# Patient Record
Sex: Male | Born: 1975 | Race: White | Hispanic: No | Marital: Married | Smoking: Former smoker
Health system: Southern US, Community
[De-identification: ages and names within clinical notes are randomized; demographics above are authoritative.]

## PROBLEM LIST (undated history)

## (undated) DIAGNOSIS — Z8719 Personal history of other diseases of the digestive system: Secondary | ICD-10-CM

## (undated) DIAGNOSIS — E785 Hyperlipidemia, unspecified: Secondary | ICD-10-CM

## (undated) DIAGNOSIS — I7121 Aneurysm of the ascending aorta, without rupture: Secondary | ICD-10-CM

## (undated) DIAGNOSIS — M87051 Idiopathic aseptic necrosis of right femur: Secondary | ICD-10-CM

## (undated) DIAGNOSIS — F17200 Nicotine dependence, unspecified, uncomplicated: Secondary | ICD-10-CM

## (undated) DIAGNOSIS — K219 Gastro-esophageal reflux disease without esophagitis: Secondary | ICD-10-CM

## (undated) HISTORY — DX: Hyperlipidemia, unspecified: E78.5

## (undated) HISTORY — DX: Aneurysm of the ascending aorta, without rupture: I71.21

## (undated) HISTORY — DX: Nicotine dependence, unspecified, uncomplicated: F17.200

## (undated) HISTORY — PX: EYE SURGERY: SHX253

## (undated) HISTORY — PX: TONSILLECTOMY: SUR1361

## (undated) HISTORY — PX: SPINE SURGERY: SHX786

## (undated) HISTORY — PX: OTHER SURGICAL HISTORY: SHX169

---

## 2000-06-04 ENCOUNTER — Emergency Department (HOSPITAL_COMMUNITY): Admission: EM | Admit: 2000-06-04 | Discharge: 2000-06-04 | Payer: Self-pay | Admitting: Emergency Medicine

## 2000-06-04 ENCOUNTER — Encounter: Payer: Self-pay | Admitting: Emergency Medicine

## 2001-10-06 ENCOUNTER — Emergency Department (HOSPITAL_COMMUNITY): Admission: EM | Admit: 2001-10-06 | Discharge: 2001-10-06 | Payer: Self-pay | Admitting: Emergency Medicine

## 2005-10-14 ENCOUNTER — Emergency Department (HOSPITAL_COMMUNITY): Admission: EM | Admit: 2005-10-14 | Discharge: 2005-10-14 | Payer: Self-pay | Admitting: Emergency Medicine

## 2005-10-14 ENCOUNTER — Encounter: Payer: Self-pay | Admitting: Orthopedic Surgery

## 2006-03-31 ENCOUNTER — Ambulatory Visit: Payer: Self-pay | Admitting: Orthopedic Surgery

## 2006-08-04 ENCOUNTER — Emergency Department (HOSPITAL_COMMUNITY): Admission: EM | Admit: 2006-08-04 | Discharge: 2006-08-04 | Payer: Self-pay | Admitting: Emergency Medicine

## 2007-04-18 ENCOUNTER — Ambulatory Visit: Payer: Self-pay | Admitting: Orthopedic Surgery

## 2007-04-18 DIAGNOSIS — M25559 Pain in unspecified hip: Secondary | ICD-10-CM | POA: Insufficient documentation

## 2007-06-22 ENCOUNTER — Encounter: Payer: Self-pay | Admitting: Orthopedic Surgery

## 2007-07-04 ENCOUNTER — Ambulatory Visit (HOSPITAL_COMMUNITY): Admission: RE | Admit: 2007-07-04 | Discharge: 2007-07-04 | Payer: Self-pay | Admitting: Orthopedic Surgery

## 2007-07-10 ENCOUNTER — Encounter: Payer: Self-pay | Admitting: Orthopedic Surgery

## 2008-01-14 ENCOUNTER — Emergency Department (HOSPITAL_COMMUNITY): Admission: EM | Admit: 2008-01-14 | Discharge: 2008-01-14 | Payer: Self-pay | Admitting: Family Medicine

## 2010-03-04 ENCOUNTER — Ambulatory Visit (INDEPENDENT_AMBULATORY_CARE_PROVIDER_SITE_OTHER): Payer: BC Managed Care – PPO | Admitting: Orthopedic Surgery

## 2010-03-04 ENCOUNTER — Encounter: Payer: Self-pay | Admitting: Orthopedic Surgery

## 2010-03-04 DIAGNOSIS — S8263XA Displaced fracture of lateral malleolus of unspecified fibula, initial encounter for closed fracture: Secondary | ICD-10-CM | POA: Insufficient documentation

## 2010-03-11 NOTE — Assessment & Plan Note (Signed)
Summary: EVAL/TREAT LT FIB FX/BRING'G FILM/REF GUARINO"NEW PROB"/BCBS,...   Vital Signs:  Patient profile:   35 year old male Height:      75 inches Weight:      260 pounds BMI:     32.62 Pulse rate:   76 / minute Resp:     18 per minute  Vitals Entered By: Fuller Canada MD (March 04, 2010 2:51 PM)  Visit Type:  new problem Referring Provider:  Dr. Wende Crease Primary Provider:  Dr. Gilmore Laroche  CC:  left ankle pain.  History of Present Illness: I saw Sergio Gonzalez in the office today for a new problem visit.  He is a 35 years old man with the complaint of:  left ankle pain.  DOI 03/02/10.  Xrays Dr. Tenna Delaine office on 03/04/10.  No meds.  This patient was referred to [place] by Dr. energetic here. On the 30th. He stepped in a hole twisted his LEFT ankle. Radiographs were brought over on a disc, but they were not of good quality, so they were repeated. He has a distal fibular avulsion type fracture, nondisplaced.  He complains of sharp throbbing, stabbing, 8/10 pain, which is constant, worse with walking relieved by rest,.  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  The review of systems is negative for Constitutional, Cardiovascular, Respiratory, Genitourinary, Neurologic, Musculoskeletal, Endocrine, Psychiatric, Skin, HEENT, Immunology, and Hemoatologic.  Physical Exam  Additional Exam:  GEN: well developed, well nourished, normal grooming and hygiene, no deformity and normal body habitus.   CDV:  pulses are normal, no edema, no erythema. no tenderness  Lymph: normal lymph nodes   Skin: no rashes, skin lesions or open sores   NEURO: normal coordination, reflexes, sensation.   Psyche: awake, alert and oriented. Mood normal   Gait:ambulation with crutches.  Inspection reveals a swollen ankle is tender medially and laterally. Primarily at the inferior pole. The fibula. Achilles Tendon nontender. He has approximately 20 of motion joint. Plantarflexion and dorsiflexion are normal. In terms of strength. Ankle feels stable.      Medications Added to Medication List This Visit: 1)  Ibuprofen 800 Mg Tabs (Ibuprofen) .Marland Kitchen.. 1 three times a day 2)  Tylenol With Codeine #3 300-30 Mg Tabs (Acetaminophen-codeine) .Marland Kitchen.. 1 q 4 as needed pain  Other Orders: Est. Patient Level IV (16109)  Patient Instructions: 1)  Wear brace x 4 weeks  2)  rest this week  3)  ice and elevate this week three times a day for 30 min  4)  then every evening  5)  take Ibuprofen 800 mg three times a day and Tylenol # 3 q 4 hrs as needed  Prescriptions: TYLENOL WITH CODEINE #3 300-30 MG TABS (ACETAMINOPHEN-CODEINE) 1 q 4 as needed pain  #40 x 1   Entered and Authorized by:   Fuller Canada MD   Signed by:   Fuller Canada MD on 03/04/2010   Method used:   Print then Give to Patient   RxID:   [id number] IBUPROFEN 800 MG  TABS (IBUPROFEN) 1 three times a day  #90 x 1   Entered and Authorized by:   Fuller Canada MD   Signed by:   Fuller Canada MD on 03/04/2010   Method used:   Print then Give to Patient   RxID:   [id number]    Orders Added: 1)  Est. Patient Level IV [14782]  Appended Document: EVAL/TREAT LT FIB FX/BRING'G FILM/REF GUARINO"NEW PROB"/BCBS,... a separate x-ray report.  AP, lateral, and oblique of the LEFT fibula  at the inferior pole of the fibula. There is a transverse nondisplaced avulsion-type injury with soft tissue swelling surrounding.  Impression avulsion Weber  A fracture, fibula.

## 2010-03-25 NOTE — Letter (Signed)
Summary: History form  History form   Imported By: Jacklynn Ganong 03/20/2010 10:52:17  _____________________________________________________________________  External Attachment:    Type:   Image     Comment:   External Document

## 2010-04-02 ENCOUNTER — Encounter: Payer: Self-pay | Admitting: Orthopedic Surgery

## 2010-04-02 ENCOUNTER — Ambulatory Visit: Payer: BC Managed Care – PPO | Admitting: Orthopedic Surgery

## 2010-05-12 ENCOUNTER — Ambulatory Visit: Payer: BC Managed Care – PPO | Admitting: Orthopedic Surgery

## 2010-05-26 ENCOUNTER — Encounter: Payer: Self-pay | Admitting: Orthopedic Surgery

## 2010-05-26 ENCOUNTER — Ambulatory Visit (INDEPENDENT_AMBULATORY_CARE_PROVIDER_SITE_OTHER): Payer: BC Managed Care – PPO | Admitting: Orthopedic Surgery

## 2010-05-26 DIAGNOSIS — S8263XA Displaced fracture of lateral malleolus of unspecified fibula, initial encounter for closed fracture: Secondary | ICD-10-CM

## 2010-05-26 NOTE — Progress Notes (Signed)
   Transverse nondisplaced fibular fracture treated as a Weber a injury doing well no problems with range of motion no brace required  Ankle has a trace drawer sign but stable with a firm endpoint and no pain on inversion.  No tenderness over the fracture site.  Impression healed Weber A-type injury treated as an ankle sprain and avulsion  Follow up as needed

## 2010-11-17 LAB — COMPREHENSIVE METABOLIC PANEL
ALT: 46
AST: 27
Albumin: 4.2
Chloride: 103
Creatinine, Ser: 0.98
GFR calc Af Amer: >60
Sodium: 138
Total Bilirubin: 0.9

## 2010-11-17 LAB — DIFFERENTIAL
Basophils Absolute: 0.1
Eosinophils Relative: 6 — ABNORMAL HIGH
Lymphocytes Relative: 28
Lymphs Abs: 2.7
Monocytes Absolute: 0.7

## 2010-11-17 LAB — CBC
MCV: 89
Platelets: 178
WBC: 9.5

## 2010-11-17 LAB — POCT CARDIAC MARKERS: Troponin i, poc: <0.05

## 2011-05-31 ENCOUNTER — Other Ambulatory Visit (HOSPITAL_COMMUNITY): Payer: Self-pay | Admitting: Chiropractic Medicine

## 2011-05-31 DIAGNOSIS — M542 Cervicalgia: Secondary | ICD-10-CM

## 2011-06-02 ENCOUNTER — Ambulatory Visit (HOSPITAL_COMMUNITY)
Admission: RE | Admit: 2011-06-02 | Discharge: 2011-06-02 | Disposition: A | Discharge status: Home / Self Care | Payer: 59 | Source: Ambulatory Visit | Attending: Chiropractic Medicine | Admitting: Chiropractic Medicine

## 2011-06-02 DIAGNOSIS — M5380 Other specified dorsopathies, site unspecified: Secondary | ICD-10-CM | POA: Insufficient documentation

## 2011-06-02 DIAGNOSIS — M5020 Other cervical disc displacement, unspecified cervical region: Secondary | ICD-10-CM | POA: Insufficient documentation

## 2011-06-02 DIAGNOSIS — M542 Cervicalgia: Secondary | ICD-10-CM

## 2011-06-02 DIAGNOSIS — M25519 Pain in unspecified shoulder: Secondary | ICD-10-CM | POA: Insufficient documentation

## 2011-08-01 ENCOUNTER — Encounter (HOSPITAL_BASED_OUTPATIENT_CLINIC_OR_DEPARTMENT_OTHER): Payer: Self-pay | Admitting: *Deleted

## 2011-08-01 ENCOUNTER — Emergency Department (HOSPITAL_BASED_OUTPATIENT_CLINIC_OR_DEPARTMENT_OTHER)
Admission: EM | Admit: 2011-08-01 | Discharge: 2011-08-01 | Disposition: A | Discharge status: Home / Self Care | Payer: 59 | Attending: Emergency Medicine | Admitting: Emergency Medicine

## 2011-08-01 DIAGNOSIS — S30861A Insect bite (nonvenomous) of abdominal wall, initial encounter: Secondary | ICD-10-CM

## 2011-08-01 DIAGNOSIS — W57XXXA Bitten or stung by nonvenomous insect and other nonvenomous arthropods, initial encounter: Secondary | ICD-10-CM | POA: Insufficient documentation

## 2011-08-01 DIAGNOSIS — S30860A Insect bite (nonvenomous) of lower back and pelvis, initial encounter: Secondary | ICD-10-CM | POA: Insufficient documentation

## 2011-08-01 DIAGNOSIS — S2095XA Superficial foreign body of unspecified parts of thorax, initial encounter: Secondary | ICD-10-CM | POA: Insufficient documentation

## 2011-08-01 MED ORDER — DOXYCYCLINE HYCLATE 100 MG PO CAPS: 100.0000 mg | ORAL_CAPSULE | Freq: Two times a day (BID) | ORAL | Status: AC

## 2011-08-01 NOTE — Discharge Instructions (Signed)
Insect Bite Mosquitoes, flies, fleas, bedbugs, and many other insects can bite. Insect bites are different from insect stings. A sting is when venom is injected into the skin. Some insect bites can transmit infectious diseases. SYMPTOMS  Insect bites usually turn red, swell, and itch for 2 to 4 days. They often go away on their own. TREATMENT  Your caregiver may prescribe antibiotic medicines if a bacterial infection develops in the bite. HOME CARE INSTRUCTIONS  Do not scratch the bite area.   Keep the bite area clean and dry. Wash the bite area thoroughly with soap and water.   Put ice or cool compresses on the bite area.   Put ice in a plastic bag.   Place a towel between your skin and the bag.   Leave the ice on for 20 minutes, 4 times a day for the first 2 to 3 days, or as directed.   You may apply a baking soda paste, cortisone cream, or calamine lotion to the bite area as directed by your caregiver. This can help reduce itching and swelling.   Only take over-the-counter or prescription medicines as directed by your caregiver.   If you are given antibiotics, take them as directed. Finish them even if you start to feel better.  You may need a tetanus shot if:  You cannot remember when you had your last tetanus shot.   You have never had a tetanus shot.   The injury broke your skin.  If you get a tetanus shot, your arm may swell, get red, and feel warm to the touch. This is common and not a problem. If you need a tetanus shot and you choose not to have one, there is a rare chance of getting tetanus. Sickness from tetanus can be serious. SEEK IMMEDIATE MEDICAL CARE IF:   You have increased pain, redness, or swelling in the bite area.   You see a red line on the skin coming from the bite.   You have a fever.   You have joint pain.   You have a headache or neck pain.   You have unusual weakness.   You have a rash.   You have chest pain or shortness of breath.   You  have abdominal pain, nausea, or vomiting.   You feel unusually tired or sleepy.  MAKE SURE YOU:   Understand these instructions.   Will watch your condition.   Will get help right away if you are not doing well or get worse.  Document Released: 02/26/2004 Document Revised: 01/07/2011 Document Reviewed: 08/19/2010 Exit[employer] Patient Information 2012 [employer]. 

## 2011-08-01 NOTE — ED Notes (Signed)
Pt presents to ED today with tick bite to lower left flank.  Pt states unsure if head was removed.  Reports puss until yesterday and now area is raised and reddened.

## 2011-08-01 NOTE — ED Provider Notes (Signed)
History  This chart was scribed for Sergio Umscheid [name], MD by Sergio Gonzalez. This patient was seen in room MH10/MH10 and the patient's care was started at 2327.  CSN: [id number]  Arrival date & time 08/01/11  2241   First MD Initiated Contact with Patient 08/01/11 2327      Chief Complaint  Patient presents with  . Tick Removal    The history is provided by the patient. No language interpreter was used.    Sergio Gonzalez is a 36 y.o. male who presents to the Emergency Department complaining of 2 weeks of a sudden onset, gradually worsening, constant tick bite. Pt states that he pulled the tick off but still felt that a piece was still in the bite. He reports that the area has become more red and swollen. He states that he had his wife use sterilized tweezers but she was unable to get the rest of the tick out. He reports having discharge consistent with pus several days ago. He has not seen a MD for the symptoms prior to today. He denies fever, nausea, emesis, HA and abdominal as associated symptoms.TD vaccine is UTD. He does not have a h/o chronic medical conditions. He is a current everyday smoker and occasional alcohol user.  No f/c/r.  No rashes not neck pain no photophobia   History reviewed. No pertinent past medical history.  Past Surgical History  Procedure Date  . Tonsillectomy   . Lasic     Family History  Problem Relation Age of Onset  . Cancer      family history   . Diabetes      family history     History  Substance Use Topics  . Smoking status: Current Everyday Smoker    Types: Cigarettes  . Smokeless tobacco: Not on file  . Alcohol Use: Yes     12 beers per week       Review of Systems  Constitutional: Negative for fever and chills.  HENT: Negative for congestion, sore throat, neck pain and neck stiffness.   Eyes: Negative for redness and itching.  Respiratory: Negative for cough and shortness of breath.   Cardiovascular: Negative for chest pain.    Gastrointestinal: Negative for nausea, vomiting, abdominal pain and diarrhea.  Genitourinary: Negative for dysuria, urgency and hematuria.  Musculoskeletal: Negative for back pain and arthralgias.  Skin: Positive for wound. Negative for rash.  Neurological: Negative for light-headedness and headaches.  Hematological: Negative for adenopathy.  All other systems reviewed and are negative.    Allergies  Review of patient's allergies indicates no known allergies.  Home Medications   Current Outpatient Rx  Name Route Sig Dispense Refill  . TRIPLE ANTIBIOTIC [phone] EX OINT Topical Apply 1 application topically once as needed. For tick bite    . OMEPRAZOLE 20 MG PO CPDR Oral Take 20 mg by mouth daily.    Marland Kitchen TAPENTADOL HCL 75 MG PO TABS Oral Take 1 tablet by mouth 2 (two) times daily.      Triage Vitals: BP 133/85  Pulse 89  Temp 98 F (36.7 C) (Oral)  Resp 18  Ht 6\' 4"  (1.93 m)  Wt 275 lb (124.739 kg)  BMI 33.47 kg/m2  SpO2 100%  Physical Exam  Nursing note and vitals reviewed. Constitutional: He is oriented to person, place, and time. He appears well-developed and well-nourished.  HENT:  Head: Normocephalic and atraumatic.  Mouth/Throat: Oropharynx is clear and moist.  Eyes: Conjunctivae and EOM are normal. Pupils  are equal, round, and reactive to light.  Neck: Normal range of motion. Neck supple. No tracheal deviation present.  Cardiovascular: Normal rate and regular rhythm.  Exam reveals no gallop and no friction rub.   No murmur heard. Pulmonary/Chest: Effort normal and breath sounds normal. No respiratory distress. He has no wheezes.  Abdominal: Soft. Bowel sounds are normal. He exhibits no distension. There is no tenderness. There is no rebound and no guarding.  Musculoskeletal: Normal range of motion. He exhibits no edema.  Lymphadenopathy:    He has no cervical adenopathy.  Neurological: He is alert and oriented to person, place, and time. He displays normal  reflexes. No cranial nerve deficit.  Skin: Skin is warm and dry.       1.5 cm round spot at the lower left flank with a scab, 3 mm crater with a small piece of what appears to be a tick pincher, nothing else noted  Psychiatric: He has a normal mood and affect. His behavior is normal.    ED Course  INCISION AND DRAINAGE Date/Time: 08/01/2011 11:38 PM Performed by: Manuela Schwartz Authorized by: Jasmine Awe Consent: Verbal consent obtained. Risks and benefits: risks, benefits and alternatives were discussed Consent given by: patient Indications for incision and drainage: Tick bite with possible remains still in skin. Body area: trunk Wound treatment: Pulled out incisors of tick with tweezers and patient tolerated well. Comments: Sterilized the 5 cm round wound/tick bite with chlorhexidine and provodone before using tweezers to excise part of the tick still left in skin. Patient tolerated well after pulling out 3 mm crater from wound and re-sterilized wound before searching again.    DIAGNOSTIC STUDIES: Oxygen Saturation is 100% on room air, normal by my interpretation.    COORDINATION OF CARE: 12:00AM-Discussed discharge plan of doxycycline with pt and pt agreed to plan. Advised pt to follow up with PCP.   Labs Reviewed - No data to display No results found.   No diagnosis found.    MDM  Symptoms consistent with an reaction to a tick bite. Will prescribe doxycycline. Follow up with PCP for testing and ongoing care return for fevers, stiff neck rashes on the skin or any concerns.  Patient and wife verbalize understanding and agree to follow up  I personally performed the services described in this documentation, which was scribed in my presence. The recorded information has been reviewed and considered.         Jasmine Awe, MD 08/02/11 [phone]

## 2011-08-18 ENCOUNTER — Encounter (HOSPITAL_COMMUNITY): Payer: Self-pay | Admitting: Pharmacy Technician

## 2011-08-18 ENCOUNTER — Other Ambulatory Visit: Payer: Self-pay | Admitting: Neurosurgery

## 2011-08-18 ENCOUNTER — Encounter (HOSPITAL_COMMUNITY): Payer: Self-pay

## 2011-08-18 ENCOUNTER — Encounter (HOSPITAL_COMMUNITY)
Admission: RE | Admit: 2011-08-18 | Discharge: 2011-08-18 | Disposition: A | Discharge status: Home / Self Care | Payer: 59 | Source: Ambulatory Visit | Attending: Neurosurgery | Admitting: Neurosurgery

## 2011-08-18 HISTORY — DX: Personal history of other diseases of the digestive system: Z87.19

## 2011-08-18 HISTORY — DX: Gastro-esophageal reflux disease without esophagitis: K21.9

## 2011-08-18 HISTORY — DX: Idiopathic aseptic necrosis of right femur: M87.051

## 2011-08-18 LAB — CBC
Platelets: 191 10*3/uL (ref 150–400)
RBC: 5.55 MIL/uL (ref 4.22–5.81)
WBC: 14.1 10*3/uL — ABNORMAL HIGH (ref 4.0–10.5)

## 2011-08-18 LAB — COMPREHENSIVE METABOLIC PANEL
ALT: 62 U/L — ABNORMAL HIGH (ref 0–53)
AST: 28 U/L (ref 0–37)
Albumin: 4.4 g/dL (ref 3.5–5.2)
CO2: 21 mEq/L (ref 19–32)
Chloride: 98 mEq/L (ref 96–112)
GFR calc non Af Amer: >90 mL/min (ref >90)
Potassium: 3.7 mEq/L (ref 3.5–5.1)
Sodium: 136 mEq/L (ref 135–145)
Total Bilirubin: 0.6 mg/dL (ref 0.3–1.2)

## 2011-08-18 LAB — SURGICAL PCR SCREEN
MRSA, PCR: NEGATIVE
Staphylococcus aureus: NEGATIVE

## 2011-08-18 NOTE — Pre-Procedure Instructions (Signed)
20 AVID GUILLETTE  08/18/2011   Your procedure is scheduled on:  July 19  Report to [facility] Stay Center at 08:30 AM.  Call this number if you have problems the morning of surgery: [phone]   Remember:   Do not eat food or water:After Midnight.  Clear liquids include soda, tea, black coffee, apple or grape juice, broth.  Take these medicines the morning of surgery with A SIP OF WATER: Omeprazole, Nucynta, Neosporin   Do not wear jewelry, make-up or nail polish.  Do not wear lotions, powders, or perfumes. You may wear deodorant.  Do not shave 48 hours prior to surgery. Men may shave face and neck.  Do not bring valuables to the hospital.  Contacts, dentures or bridgework may not be worn into surgery.  Leave suitcase in the car. After surgery it may be brought to your room.  For patients admitted to the hospital, checkout time is 11:00 AM the day of discharge.   Special Instructions: CHG Shower Use Special Wash: 1/2 bottle night before surgery and 1/2 bottle morning of surgery.   Please read over the following fact sheets that you were given: Pain Booklet, Coughing and Deep Breathing and Surgical Site Infection Prevention

## 2011-08-18 NOTE — Pre-Procedure Instructions (Signed)
20 Sergio Gonzalez  08/18/2011   Your procedure is scheduled on:  July 19  Report to [facility] Short Stay Center at 08:30 AM.  Call this number if you have problems the morning of surgery: [phone]   Remember:   Do not eat food or water:After Midnight.  Clear liquids include soda, tea, black coffee, apple or grape juice, broth.  Take these medicines the morning of surgery with A SIP OF WATER: Omeprazole, Nucynta, Neosporin, Prednisone, Hydrocodone,    Do not wear jewelry, make-up or nail polish.  Do not wear lotions, powders, or perfumes. You may wear deodorant.  Do not shave 48 hours prior to surgery. Men may shave face and neck.  Do not bring valuables to the hospital.  Contacts, dentures or bridgework may not be worn into surgery.  Leave suitcase in the car. After surgery it may be brought to your room.  For patients admitted to the hospital, checkout time is 11:00 AM the day of discharge.   Special Instructions: CHG Shower Use Special Wash: 1/2 bottle night before surgery and 1/2 bottle morning of surgery.   Please read over the following fact sheets that you were given: Pain Booklet, Coughing and Deep Breathing and Surgical Site Infection Prevention

## 2011-08-19 MED ORDER — CEFAZOLIN SODIUM-DEXTROSE 2-3 GM-% IV SOLR
2.0000 g | INTRAVENOUS | Status: AC
  Administered 2011-08-20: 2 g via INTRAVENOUS
  Filled 2011-08-19: qty 50

## 2011-08-19 MED ORDER — DEXAMETHASONE SODIUM PHOSPHATE 10 MG/ML IJ SOLN
10.0000 mg | INTRAMUSCULAR | Status: AC
  Administered 2011-08-20: 10 mg via INTRAVENOUS
  Filled 2011-08-19: qty 1

## 2011-08-20 ENCOUNTER — Encounter (HOSPITAL_COMMUNITY): Payer: Self-pay | Admitting: Critical Care Medicine

## 2011-08-20 ENCOUNTER — Encounter (HOSPITAL_COMMUNITY): Payer: Self-pay | Admitting: *Deleted

## 2011-08-20 ENCOUNTER — Ambulatory Visit (HOSPITAL_COMMUNITY): Payer: 59 | Admitting: Critical Care Medicine

## 2011-08-20 ENCOUNTER — Ambulatory Visit (HOSPITAL_COMMUNITY): Payer: 59

## 2011-08-20 ENCOUNTER — Encounter (HOSPITAL_COMMUNITY)
Admission: RE | Disposition: A | Discharge status: Home / Self Care | Payer: Self-pay | Source: Ambulatory Visit | Attending: Neurosurgery

## 2011-08-20 ENCOUNTER — Ambulatory Visit (HOSPITAL_COMMUNITY)
Admission: RE | Admit: 2011-08-20 | Discharge: 2011-08-21 | Disposition: A | Discharge status: Home / Self Care | Payer: 59 | Source: Ambulatory Visit | Attending: Neurosurgery | Admitting: Neurosurgery

## 2011-08-20 DIAGNOSIS — F17200 Nicotine dependence, unspecified, uncomplicated: Secondary | ICD-10-CM | POA: Insufficient documentation

## 2011-08-20 DIAGNOSIS — Z01812 Encounter for preprocedural laboratory examination: Secondary | ICD-10-CM | POA: Insufficient documentation

## 2011-08-20 DIAGNOSIS — M5020 Other cervical disc displacement, unspecified cervical region: Secondary | ICD-10-CM | POA: Insufficient documentation

## 2011-08-20 DIAGNOSIS — K449 Diaphragmatic hernia without obstruction or gangrene: Secondary | ICD-10-CM | POA: Insufficient documentation

## 2011-08-20 DIAGNOSIS — K219 Gastro-esophageal reflux disease without esophagitis: Secondary | ICD-10-CM | POA: Insufficient documentation

## 2011-08-20 HISTORY — PX: ANTERIOR CERVICAL DECOMP/DISCECTOMY FUSION: SHX1161

## 2011-08-20 SURGERY — ANTERIOR CERVICAL DECOMPRESSION/DISCECTOMY FUSION 2 LEVELS
Anesthesia: General | Site: Neck | Laterality: Bilateral | Wound class: Clean

## 2011-08-20 MED ORDER — ONDANSETRON HCL 4 MG/2ML IJ SOLN: 4.0000 mg | INTRAMUSCULAR | Status: DC | PRN

## 2011-08-20 MED ORDER — SODIUM CHLORIDE 0.9 % IR SOLN
Status: DC | PRN
  Administered 2011-08-20: 11:00:00

## 2011-08-20 MED ORDER — ACETAMINOPHEN 650 MG RE SUPP: 650.0000 mg | RECTAL | Status: DC | PRN

## 2011-08-20 MED ORDER — SODIUM CHLORIDE 0.9 % IJ SOLN
3.0000 mL | Freq: Two times a day (BID) | INTRAMUSCULAR | Status: DC
  Administered 2011-08-20 (×2): 3 mL via INTRAVENOUS

## 2011-08-20 MED ORDER — NEOSTIGMINE METHYLSULFATE 1 MG/ML IJ SOLN
INTRAMUSCULAR | Status: DC | PRN
  Administered 2011-08-20: 5 mg via INTRAVENOUS

## 2011-08-20 MED ORDER — ONDANSETRON HCL 4 MG/2ML IJ SOLN: 4.0000 mg | Freq: Once | INTRAMUSCULAR | Status: DC | PRN

## 2011-08-20 MED ORDER — HYDROMORPHONE HCL PF 1 MG/ML IJ SOLN
1.0000 mg | INTRAMUSCULAR | Status: DC | PRN
  Administered 2011-08-20 – 2011-08-21 (×3): 1.5 mg via INTRAMUSCULAR
  Filled 2011-08-20 (×3): qty 2

## 2011-08-20 MED ORDER — THROMBIN 5000 UNITS EX KIT
PACK | CUTANEOUS | Status: DC | PRN
  Administered 2011-08-20 (×2): 5000 [IU] via TOPICAL

## 2011-08-20 MED ORDER — MENTHOL 3 MG MT LOZG
1.0000 | LOZENGE | OROMUCOSAL | Status: DC | PRN
  Administered 2011-08-20: 3 mg via ORAL
  Filled 2011-08-20: qty 9

## 2011-08-20 MED ORDER — SODIUM CHLORIDE 0.9 % IJ SOLN: 3.0000 mL | INTRAMUSCULAR | Status: DC | PRN

## 2011-08-20 MED ORDER — CYCLOBENZAPRINE HCL 10 MG PO TABS
10.0000 mg | ORAL_TABLET | Freq: Three times a day (TID) | ORAL | Status: DC | PRN
  Administered 2011-08-20 (×2): 10 mg via ORAL
  Filled 2011-08-20: qty 1

## 2011-08-20 MED ORDER — HYDROMORPHONE HCL PF 1 MG/ML IJ SOLN
0.2500 mg | INTRAMUSCULAR | Status: DC | PRN
  Administered 2011-08-20 (×4): 0.5 mg via INTRAVENOUS

## 2011-08-20 MED ORDER — 0.9 % SODIUM CHLORIDE (POUR BTL) OPTIME
TOPICAL | Status: DC | PRN
  Administered 2011-08-20: 1000 mL

## 2011-08-20 MED ORDER — DEXAMETHASONE 4 MG PO TABS
4.0000 mg | ORAL_TABLET | Freq: Four times a day (QID) | ORAL | Status: AC
  Administered 2011-08-20: 4 mg via ORAL
  Filled 2011-08-20: qty 1

## 2011-08-20 MED ORDER — FENTANYL CITRATE 0.05 MG/ML IJ SOLN
INTRAMUSCULAR | Status: DC | PRN
  Administered 2011-08-20: 150 ug via INTRAVENOUS
  Administered 2011-08-20 (×3): 50 ug via INTRAVENOUS

## 2011-08-20 MED ORDER — BACITRACIN ZINC 500 UNIT/GM EX OINT: TOPICAL_OINTMENT | CUTANEOUS | Status: DC | PRN

## 2011-08-20 MED ORDER — PHENOL 1.4 % MT LIQD
1.0000 | OROMUCOSAL | Status: DC | PRN
  Administered 2011-08-21: 1 via OROMUCOSAL
  Filled 2011-08-20: qty 177

## 2011-08-20 MED ORDER — ZOLPIDEM TARTRATE 5 MG PO TABS: 5.0000 mg | ORAL_TABLET | Freq: Every evening | ORAL | Status: DC | PRN

## 2011-08-20 MED ORDER — LACTATED RINGERS IV SOLN
INTRAVENOUS | Status: DC | PRN
  Administered 2011-08-20 (×2): via INTRAVENOUS

## 2011-08-20 MED ORDER — HYDROCODONE-ACETAMINOPHEN 5-325 MG PO TABS
1.0000 | ORAL_TABLET | ORAL | Status: DC | PRN
  Administered 2011-08-20 (×2): 2 via ORAL
  Administered 2011-08-20: 1 via ORAL
  Administered 2011-08-21 (×2): 2 via ORAL
  Filled 2011-08-20 (×4): qty 2

## 2011-08-20 MED ORDER — DEXAMETHASONE SODIUM PHOSPHATE 4 MG/ML IJ SOLN
4.0000 mg | Freq: Four times a day (QID) | INTRAMUSCULAR | Status: AC
  Administered 2011-08-20: 4 mg via INTRAVENOUS
  Filled 2011-08-20: qty 1

## 2011-08-20 MED ORDER — VECURONIUM BROMIDE 10 MG IV SOLR
INTRAVENOUS | Status: DC | PRN
  Administered 2011-08-20: 2 mg via INTRAVENOUS
  Administered 2011-08-20: 4 mg via INTRAVENOUS
  Administered 2011-08-20: 2 mg via INTRAVENOUS

## 2011-08-20 MED ORDER — LIDOCAINE HCL (CARDIAC) 20 MG/ML IV SOLN
INTRAVENOUS | Status: DC | PRN
  Administered 2011-08-20: 100 mg via INTRAVENOUS

## 2011-08-20 MED ORDER — GABAPENTIN 300 MG PO CAPS
300.0000 mg | ORAL_CAPSULE | Freq: Two times a day (BID) | ORAL | Status: DC
  Administered 2011-08-20 – 2011-08-21 (×3): 300 mg via ORAL
  Filled 2011-08-20 (×4): qty 1

## 2011-08-20 MED ORDER — CYCLOBENZAPRINE HCL 10 MG PO TABS
ORAL_TABLET | ORAL | Status: AC
  Filled 2011-08-20: qty 1

## 2011-08-20 MED ORDER — ROCURONIUM BROMIDE 100 MG/10ML IV SOLN
INTRAVENOUS | Status: DC | PRN
  Administered 2011-08-20: 50 mg via INTRAVENOUS

## 2011-08-20 MED ORDER — ACETAMINOPHEN 325 MG PO TABS: 650.0000 mg | ORAL_TABLET | ORAL | Status: DC | PRN

## 2011-08-20 MED ORDER — CEFAZOLIN SODIUM 1-5 GM-% IV SOLN
1.0000 g | Freq: Three times a day (TID) | INTRAVENOUS | Status: AC
  Administered 2011-08-20 – 2011-08-21 (×2): 1 g via INTRAVENOUS
  Filled 2011-08-20 (×2): qty 50

## 2011-08-20 MED ORDER — ONDANSETRON HCL 4 MG/2ML IJ SOLN
INTRAMUSCULAR | Status: DC | PRN
  Administered 2011-08-20: 4 mg via INTRAVENOUS

## 2011-08-20 MED ORDER — ARTIFICIAL TEARS OP OINT
TOPICAL_OINTMENT | OPHTHALMIC | Status: DC | PRN
  Administered 2011-08-20: 1 via OPHTHALMIC

## 2011-08-20 MED ORDER — BACITRACIN 50000 UNITS IM SOLR
INTRAMUSCULAR | Status: AC
  Filled 2011-08-20: qty 1

## 2011-08-20 MED ORDER — PANTOPRAZOLE SODIUM 40 MG PO TBEC
40.0000 mg | DELAYED_RELEASE_TABLET | Freq: Every day | ORAL | Status: DC
  Administered 2011-08-21: 40 mg via ORAL
  Filled 2011-08-20: qty 1

## 2011-08-20 MED ORDER — HYDROMORPHONE HCL PF 1 MG/ML IJ SOLN
INTRAMUSCULAR | Status: AC
  Filled 2011-08-20: qty 1

## 2011-08-20 MED ORDER — GLYCOPYRROLATE 0.2 MG/ML IJ SOLN
INTRAMUSCULAR | Status: DC | PRN
  Administered 2011-08-20: .8 mg via INTRAVENOUS

## 2011-08-20 MED ORDER — PROPOFOL 10 MG/ML IV EMUL
INTRAVENOUS | Status: DC | PRN
  Administered 2011-08-20 (×2): 100 mg via INTRAVENOUS

## 2011-08-20 MED ORDER — SODIUM CHLORIDE 0.9 % IV SOLN
250.0000 mL | INTRAVENOUS | Status: DC
  Administered 2011-08-20: 250 mL via INTRAVENOUS

## 2011-08-20 MED ORDER — SODIUM CHLORIDE 0.9 % IV SOLN
INTRAVENOUS | Status: AC
  Filled 2011-08-20: qty 500

## 2011-08-20 MED ORDER — HYDROCODONE-ACETAMINOPHEN 5-325 MG PO TABS
ORAL_TABLET | ORAL | Status: AC
  Filled 2011-08-20: qty 1

## 2011-08-20 MED ORDER — MIDAZOLAM HCL 5 MG/5ML IJ SOLN
INTRAMUSCULAR | Status: DC | PRN
  Administered 2011-08-20: 2 mg via INTRAVENOUS

## 2011-08-20 MED ORDER — HEMOSTATIC AGENTS (NO CHARGE) OPTIME
TOPICAL | Status: DC | PRN
  Administered 2011-08-20: 1 via TOPICAL

## 2011-08-20 SURGICAL SUPPLY — 60 items
APL SKNCLS STERI-STRIP NONHPOA (GAUZE/BANDAGES/DRESSINGS) ×1
BAG DECANTER FOR FLEXI CONT (MISCELLANEOUS) ×2 IMPLANT
BENZOIN TINCTURE PRP APPL 2/3 (GAUZE/BANDAGES/DRESSINGS) ×4 IMPLANT
BIT DRILL TRINICA 2.3MM (BIT) IMPLANT
BRUSH SCRUB EZ PLAIN DRY (MISCELLANEOUS) ×2 IMPLANT
CANISTER SUCTION 2500CC (MISCELLANEOUS) ×2 IMPLANT
CLOTH BEACON ORANGE TIMEOUT ST (SAFETY) ×2 IMPLANT
CONT SPEC 4OZ CLIKSEAL STRL BL (MISCELLANEOUS) ×2 IMPLANT
DRAPE C-ARM 42X72 X-RAY (DRAPES) ×4 IMPLANT
DRAPE LAPAROTOMY 100X72 PEDS (DRAPES) ×2 IMPLANT
DRAPE MICROSCOPE LEICA (MISCELLANEOUS) ×1 IMPLANT
DRAPE MICROSCOPE ZEISS OPMI (DRAPES) ×1 IMPLANT
DRAPE SURG 17X23 STRL (DRAPES) ×4 IMPLANT
DRESSING TELFA 8X3 (GAUZE/BANDAGES/DRESSINGS) ×2 IMPLANT
DRILL BIT TRINICA 2.3MM (BIT)
ELECT COATED BLADE 2.86 ST (ELECTRODE) ×2 IMPLANT
ELECT REM PT RETURN 9FT ADLT (ELECTROSURGICAL) ×2
ELECTRODE REM PT RTRN 9FT ADLT (ELECTROSURGICAL) ×1 IMPLANT
GAUZE SPONGE 4X4 16PLY XRAY LF (GAUZE/BANDAGES/DRESSINGS) IMPLANT
GLOVE BIO SURGEON STRL SZ8.5 (GLOVE) ×1 IMPLANT
GLOVE BIOGEL PI IND STRL 7.0 (GLOVE) IMPLANT
GLOVE BIOGEL PI IND STRL 8 (GLOVE) IMPLANT
GLOVE BIOGEL PI INDICATOR 7.0 (GLOVE) ×2
GLOVE BIOGEL PI INDICATOR 8 (GLOVE) ×1
GLOVE ECLIPSE 7.5 STRL STRAW (GLOVE) ×4 IMPLANT
GLOVE EXAM NITRILE LRG STRL (GLOVE) ×1 IMPLANT
GLOVE EXAM NITRILE MD LF STRL (GLOVE) ×2 IMPLANT
GLOVE EXAM NITRILE XL STR (GLOVE) IMPLANT
GLOVE EXAM NITRILE XS STR PU (GLOVE) IMPLANT
GLOVE SS BIOGEL STRL SZ 8 (GLOVE) IMPLANT
GLOVE SUPERSENSE BIOGEL SZ 8 (GLOVE) ×1
GLOVE SURG SS PI 6.5 STRL IVOR (GLOVE) ×3 IMPLANT
GOWN BRE IMP SLV AUR LG STRL (GOWN DISPOSABLE) ×2 IMPLANT
GOWN BRE IMP SLV AUR XL STRL (GOWN DISPOSABLE) ×3 IMPLANT
GOWN STRL REIN 2XL LVL4 (GOWN DISPOSABLE) ×1 IMPLANT
HEAD HALTER (SOFTGOODS) ×2 IMPLANT
INTERBODY TM 11X14X7-7DEG ANG (Metal Cage) ×1 IMPLANT
KIT BASIN OR (CUSTOM PROCEDURE TRAY) ×2 IMPLANT
KIT ROOM TURNOVER OR (KITS) ×2 IMPLANT
NDL SPNL 20GX3.5 QUINCKE YW (NEEDLE) ×1 IMPLANT
NEEDLE SPNL 20GX3.5 QUINCKE YW (NEEDLE) ×2 IMPLANT
NS IRRIG 1000ML POUR BTL (IV SOLUTION) ×2 IMPLANT
PACK LAMINECTOMY NEURO (CUSTOM PROCEDURE TRAY) ×2 IMPLANT
PAD ARMBOARD 7.5X6 YLW CONV (MISCELLANEOUS) ×2 IMPLANT
PATTIES SURGICAL .75X.75 (GAUZE/BANDAGES/DRESSINGS) ×1 IMPLANT
PUTTY BONE GRAFT KIT 2.5ML (Bone Implant) ×1 IMPLANT
RUBBERBAND STERILE (MISCELLANEOUS) ×4 IMPLANT
SPACER TMS 11X14X6MM (Spacer) ×1 IMPLANT
SPONGE GAUZE 4X4 12PLY (GAUZE/BANDAGES/DRESSINGS) ×3 IMPLANT
SPONGE INTESTINAL PEANUT (DISPOSABLE) ×2 IMPLANT
SPONGE SURGIFOAM ABS GEL SZ50 (HEMOSTASIS) ×2 IMPLANT
STRIP CLOSURE SKIN 1/2X4 (GAUZE/BANDAGES/DRESSINGS) ×2 IMPLANT
SUT PDS AB 5-0 P3 18 (SUTURE) ×2 IMPLANT
SUT VIC AB 3-0 CP2 18 (SUTURE) ×2 IMPLANT
SYR 20ML ECCENTRIC (SYRINGE) ×1 IMPLANT
TOOL MATCHSTK 3MM (MISCELLANEOUS) ×2 IMPLANT
TOWEL OR 17X24 6PK STRL BLUE (TOWEL DISPOSABLE) ×2 IMPLANT
TOWEL OR 17X26 10 PK STRL BLUE (TOWEL DISPOSABLE) ×2 IMPLANT
TRAP SPECIMEN MUCOUS 40CC (MISCELLANEOUS) ×1 IMPLANT
WATER STERILE IRR 1000ML POUR (IV SOLUTION) ×2 IMPLANT

## 2011-08-20 NOTE — Anesthesia Procedure Notes (Signed)
Procedure Name: Intubation Date/Time: 08/20/2011 10:22 AM Performed by: Elon Alas Pre-anesthesia Checklist: Patient identified, Timeout performed, Emergency Drugs available, Suction available and Patient being monitored Patient Re-evaluated:Patient Re-evaluated prior to inductionOxygen Delivery Method: Circle system utilized Preoxygenation: Pre-oxygenation with 100% oxygen Intubation Type: IV induction Ventilation: Oral airway inserted - appropriate to patient size, Mask ventilation with difficulty and Two handed mask ventilation required Laryngoscope Size: Mac and 4 Grade View: Grade II Tube type: Oral Tube size: 7.5 mm Number of attempts: 1 Airway Equipment and Method: Stylet Placement Confirmation: positive ETCO2,  ETT inserted through vocal cords under direct vision and breath sounds checked- equal and bilateral Secured at: 24 cm Tube secured with: Tape Dental Injury: Teeth and Oropharynx as per pre-operative assessment

## 2011-08-20 NOTE — Anesthesia Postprocedure Evaluation (Signed)
Anesthesia Post Note  Patient: Sergio Gonzalez  Procedure(s) Performed: Procedure(s) (LRB): ANTERIOR CERVICAL DECOMPRESSION/DISCECTOMY FUSION 2 LEVELS (Bilateral)  Anesthesia type: general  Patient location: PACU  Post pain: Pain level controlled  Post assessment: Patient's Cardiovascular Status Stable  Last Vitals:  Filed Vitals:   08/20/11 1240  BP:   Pulse:   Temp: 36 C  Resp:     Post vital signs: Reviewed and stable  Level of consciousness: sedated  Complications: No apparent anesthesia complications

## 2011-08-20 NOTE — Preoperative (Signed)
Beta Blockers   Reason not to administer Beta Blockers:Not Applicable 

## 2011-08-20 NOTE — Transfer of Care (Signed)
Immediate Anesthesia Transfer of Care Note  Patient: Sergio Gonzalez  Procedure(s) Performed: Procedure(s) (LRB): ANTERIOR CERVICAL DECOMPRESSION/DISCECTOMY FUSION 2 LEVELS (Bilateral)  Patient Location: PACU  Anesthesia Type: General  Level of Consciousness: awake, alert  and oriented  Airway & Oxygen Therapy: Patient Spontanous Breathing and Patient connected to nasal cannula oxygen  Post-op Assessment: Report given to PACU RN, Post -op Vital signs reviewed and stable and Patient moving all extremities X 4  Post vital signs: Reviewed and stable  Complications: No apparent anesthesia complications

## 2011-08-20 NOTE — Plan of Care (Signed)
Problem: Consults Goal: Diagnosis - Spinal Surgery Outcome: Completed/Met Date Met:  08/20/11 Cervical Spine Fusion     

## 2011-08-20 NOTE — Op Note (Signed)
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 Preoperative fluoroscopy was used prior to incision to identify the appropriate levels. Transverse incision was made in the right anterior neck started the midline and headed towards the medial aspect of the sternocleidomastoid muscle. The platysma muscle was incised transversely and the natural fascial plane between the strap muscles medially and the sternal cremaster laterally was identified and followed down to the anterior aspect the cervical spine. Longus Cole muscles were identified split in the midline to play bilaterally with unipolar coagulation and Barista. Self-retaining tract was placed for exposure and x-ray showed approach the appropriate levels. Using a 15 blade the ends disc at C5-6 and C6-7 was incised. Using pituitary rongeurs and curettes approximately 90% of the disc material was removed. High-speed drill was used to widen the interspace and bony shavings were saved for use later in the case. At this time the microscope was draped brought into the field and used for the remainder of the case. Starting at C5-6 the remainder of the disc material down the posterior longitudinal ligament was removed. Ligament was then incised transversely and the cut edges removed a Kerrison punch. Herniated disc material in the right paramedian location was identified and removed to give excellent decompression the underlying spinal dura. We continued the decompression up to the proximal foramen so the C6 nerve root was well visualized well decompressed. Her decompression was carried on the opposite side. Attention was then turned to  C6-7 similar discectomy was carried out. Once again the decompression was carried down to the posterior longitudinal ligament which was then removed in a piecemeal fashion. Aggressive decompression was carried out. As we approached the C6-7 foramen on the left for large amounts of large herniated disc fragment identified and removed to give excellent decompression of the underlying C7 nerve root. Posterior longitudinal ligament was removed across the opposite side until the decompression was complete. At this time inspection was carried out in all directions for any evidence of residual compression and none could be identified. Measurements were taken and a 6 mm 7 mm lordotic graft was chosen. Both filled with a mixture of autologous bone morselized allograft. After confirming hemostasis the 6 metal graft was impacted C5-6 and 7 mm graft impacted C6-7. Fluoroscopy showed to be in excellent position. An appropriately length Trinica anterior cervical plate was then chosen. Under fluoroscopic guidance drill holes were placed and we placed 14 mm screws x6. Locking mechanism was then rotated into the locked position and fossae showed good placement of the plates screws and plugs. Irrigation was carried out and any bleeding control proper coagulation. Was then closed with inverted Vicryl on the platysma muscle inverted 5-0 PDS in the subcuticular layer and Steri-Strips were placed on the skin. Shortness was then applied the patient was extubated and taken to recovery room in stable condition.

## 2011-08-20 NOTE — H&P (Signed)
Sergio Gonzalez is an 36 y.o. male.   Chief Complaint: Left arm pain HPI: The patient is a 36 year old gentleman who presented recently with a significant history of neck and left arm pain. He been tried on conservative therapy with injections and prednisone he got some temporary relief but then the pain recurred not extremely severe to the point where he can hardly do any activities of daily living. An MRI scan was ranged which a disc abnormality at C5-6 and C6-7. After discussing the options the patient requested surgery and now comes for a two-level anterior cervical discectomy with fusion and plating. I had a long discussion with him regarding the risks and benefits of surgical intervention. The risks discussed include but are not limited to bleeding infection weakness numbness paralysis spinal fluid leakage coma hoarseness and death. We have discussed alternative methods of therapy along with the risks and benefits of nonintervention. He has had the opportunity to ask numerous questions and appears to understand. With this information in hand he has requested that we proceed with surgery.  Past Medical History  Diagnosis Date  . Avascular necrosis of femur head, right   . GERD (gastroesophageal reflux disease)   . H/O hiatal hernia     Past Surgical History  Procedure Date  . Tonsillectomy   . Lasic     Family History  Problem Relation Age of Onset  . Cancer      family history   . Diabetes      family history    Social History:  reports that he has been smoking Cigarettes.  He uses smokeless tobacco. He reports that he drinks alcohol. He reports that he does not use illicit drugs.  Allergies: No Known Allergies  Medications Prior to Admission  Medication Sig Dispense Refill  . gabapentin (NEURONTIN) 300 MG capsule Take 300 mg by mouth 2 (two) times daily.      Marland Kitchen HYDROcodone-acetaminophen (NORCO/VICODIN) 5-325 MG per tablet Take 1 tablet by mouth every 4 (four) hours as needed.  For pain      . omeprazole (PRILOSEC) 40 MG capsule Take 40 mg by mouth daily.      . predniSONE (STERAPRED UNI-PAK) 10 MG tablet Take 10 mg by mouth daily.        Results for orders placed during the hospital encounter of 08/18/11 (from the past 48 hour(s))  CBC     Status: Abnormal   Collection Time   08/18/11  1:15 PM      Component Value Range Comment   WBC 14.1 (*) 4.0 - 10.5 K/uL    RBC 5.55  4.22 - 5.81 MIL/uL    Hemoglobin 17.3 (*) 13.0 - 17.0 g/dL    HCT 16.1  09.6 - 04.5 %    MCV 88.5  78.0 - 100.0 fL    MCH 31.2  26.0 - 34.0 pg    MCHC 35.2  30.0 - 36.0 g/dL    RDW 40.9  81.1 - 91.4 %    Platelets 191  150 - 400 K/uL   COMPREHENSIVE METABOLIC PANEL     Status: Abnormal   Collection Time   08/18/11  1:15 PM      Component Value Range Comment   Sodium 136  135 - 145 mEq/L    Potassium 3.7  3.5 - 5.1 mEq/L    Chloride 98  96 - 112 mEq/L    CO2 21  19 - 32 mEq/L    Glucose, Bld 147 (*) 70 -  99 mg/dL    BUN 17  6 - 23 mg/dL    Creatinine, Ser 1.61  0.50 - 1.35 mg/dL    Calcium 9.9  8.4 - 09.6 mg/dL    Total Protein 7.7  6.0 - 8.3 g/dL    Albumin 4.4  3.5 - 5.2 g/dL    AST 28  0 - 37 U/L    ALT 62 (*) 0 - 53 U/L    Alkaline Phosphatase 97  39 - 117 U/L    Total Bilirubin 0.6  0.3 - 1.2 mg/dL    GFR calc non Af Amer >90  >90 mL/min    GFR calc Af Amer >90  >90 mL/min   SURGICAL PCR SCREEN     Status: Normal   Collection Time   08/18/11  1:18 PM      Component Value Range Comment   MRSA, PCR NEGATIVE  NEGATIVE    Staphylococcus aureus NEGATIVE  NEGATIVE    No results found.  A comprehensive review of systems was negative.  Blood pressure 130/86, pulse 71, temperature 98.2 F (36.8 C), temperature source Oral, resp. rate 20, SpO2 99.00%.  The patient is awake alert and oriented. He is no facial asymmetry. His strength is decreased at the left triceps muscle and his sensation is decreased on the left index finger. Assessment/Plan Impression is that of herniated  disc at C5-6 and a large disc herniation C6-7. Is for a two-level anterior cervical discectomy with fusion and plating.  Sergio Meeker, MD 08/20/2011, 9:48 AM

## 2011-08-20 NOTE — Anesthesia Preprocedure Evaluation (Addendum)
Anesthesia Evaluation  Patient identified by MRN, date of birth, ID band Patient awake    Reviewed: Allergy & Precautions, H&P , NPO status , Patient's Chart, lab work & pertinent test results  Airway Mallampati: I TM Distance: >3 FB Neck ROM: Full    Dental  (+) Dental Advisory Given   Pulmonary Current Smoker,          Cardiovascular     Neuro/Psych    GI/Hepatic hiatal hernia, GERD-  Medicated,  Endo/Other    Renal/GU      Musculoskeletal   Abdominal   Peds  Hematology   Anesthesia Other Findings   Reproductive/Obstetrics                          Anesthesia Physical Anesthesia Plan  ASA: II  Anesthesia Plan: General   Post-op Pain Management:    Induction: Intravenous  Airway Management Planned: Oral ETT  Additional Equipment:   Intra-op Plan:   Post-operative Plan: Extubation in OR  Informed Consent: I have reviewed the patients History and Physical, chart, labs and discussed the procedure including the risks, benefits and alternatives for the proposed anesthesia with the patient or authorized representative who has indicated his/her understanding and acceptance.   Dental advisory given  Plan Discussed with: Surgeon and CRNA  Anesthesia Plan Comments:        Anesthesia Quick Evaluation

## 2011-08-21 NOTE — Discharge Summary (Signed)
Physician Discharge Summary  Patient ID: KENNEITH STIEF MRN: [id number] DOB/AGE: [birth year] 35 y.o.  Admit date: 08/20/2011 Discharge date: 08/21/2011  Admission Diagnoses:c56, c67 hnp  Discharge Diagnoses: same   Discharged Condition: no weakness  Hospital Course: surgery  Consults: none  Significant Diagnostic Studies: mri  Treatments: cervical fusion 5  To 7  Discharge Exam: Blood pressure 116/75, pulse 78, temperature 97.6 F (36.4 C), temperature source Oral, resp. rate 18, SpO2 97.00%. Wound dry, no weakness  Disposition: home on percocet and diazepam   Medication List  As of 08/21/2011  8:32 AM   ASK your doctor about these medications         gabapentin 300 MG capsule   Commonly known as: NEURONTIN   Take 300 mg by mouth 2 (two) times daily.      HYDROcodone-acetaminophen 5-325 MG per tablet   Commonly known as: NORCO/VICODIN   Take 1 tablet by mouth every 4 (four) hours as needed. For pain      omeprazole 40 MG capsule   Commonly known as: PRILOSEC   Take 40 mg by mouth daily.      predniSONE 10 MG tablet   Commonly known as: STERAPRED UNI-PAK   Take 10 mg by mouth daily.             Signed: Karn Cassis 08/21/2011, 8:32 AM

## 2011-08-23 ENCOUNTER — Encounter (HOSPITAL_COMMUNITY): Payer: Self-pay | Admitting: Neurosurgery

## 2012-04-24 ENCOUNTER — Telehealth: Payer: Self-pay | Admitting: Family Medicine

## 2012-04-24 MED ORDER — OMEPRAZOLE 40 MG PO CPDR: 40.0000 mg | DELAYED_RELEASE_CAPSULE | Freq: Every day | ORAL | Status: DC

## 2012-04-24 NOTE — Telephone Encounter (Signed)
rx refilled.

## 2012-06-08 ENCOUNTER — Emergency Department (INDEPENDENT_AMBULATORY_CARE_PROVIDER_SITE_OTHER): Payer: BC Managed Care – PPO

## 2012-06-08 ENCOUNTER — Emergency Department (HOSPITAL_COMMUNITY)
Admission: EM | Admit: 2012-06-08 | Discharge: 2012-06-08 | Disposition: A | Discharge status: Home / Self Care | Payer: BC Managed Care – PPO | Source: Home / Self Care | Attending: Family Medicine | Admitting: Family Medicine

## 2012-06-08 ENCOUNTER — Encounter (HOSPITAL_COMMUNITY): Payer: Self-pay | Admitting: Emergency Medicine

## 2012-06-08 DIAGNOSIS — S93409A Sprain of unspecified ligament of unspecified ankle, initial encounter: Secondary | ICD-10-CM

## 2012-06-08 MED ORDER — IBUPROFEN 800 MG PO TABS: 800.0000 mg | ORAL_TABLET | Freq: Three times a day (TID) | ORAL | Status: DC

## 2012-06-08 NOTE — ED Notes (Signed)
Pt c/o injury to left foot from jumping off a tractor two days ago.  Pt states that the left foot did not roll or bend in any awkward position.  States having pain on the top of foot that has gotten progressively worse. With walking pain shoots up leg. Unable to flex/extend foot without pain.

## 2012-06-08 NOTE — ED Provider Notes (Signed)
History     CSN: [id number]  Arrival date & time 06/08/12  1723   First MD Initiated Contact with Patient 06/08/12 1828      Chief Complaint  Patient presents with  . Foot Injury    left foot injury from jumping off tractor two days ago    (Consider location/radiation/quality/duration/timing/severity/associated sxs/prior treatment) Patient is a 37 y.o. male presenting with ankle pain. The history is provided by the patient.  Ankle Pain Location:  Ankle Time since incident:  3 days Injury: yes   Mechanism of injury: fall   Mechanism of injury comment:  Jumped off back of tractor on mon, pain since. Fall:    Fall occurred:  Jumping from height   Impact surface:  Dirt Pain details:    Quality:  Burning and throbbing   Radiates to:  Does not radiate   Timing:  Constant   Progression:  Worsening (because he has been walking on ankle since.) Chronicity:  New Dislocation: no   Foreign body present:  No foreign bodies   Past Medical History  Diagnosis Date  . Avascular necrosis of femur head, right   . GERD (gastroesophageal reflux disease)   . H/O hiatal hernia     Past Surgical History  Procedure Laterality Date  . Tonsillectomy    . Lasic    . Anterior cervical decomp/discectomy fusion  08/20/2011    Procedure: ANTERIOR CERVICAL DECOMPRESSION/DISCECTOMY FUSION 2 LEVELS;  Surgeon: [name], MD;  Location: [facility];  Service: Neurosurgery;  Laterality: Bilateral;  Cervical five-six, six-seven anterior cervical discectomy with fusion    Family History  Problem Relation Age of Onset  . Cancer      family history   . Diabetes      family history     History  Substance Use Topics  . Smoking status: Current Some Day Smoker    Types: Cigarettes  . Smokeless tobacco: Current User  . Alcohol Use: Yes     Comment: 12 beers per week, No had in about 2 weeks      Review of Systems  Constitutional: Negative.   Musculoskeletal: Positive for joint swelling.   Skin: Negative.     Allergies  Review of patient's allergies indicates no known allergies.  Home Medications   Current Outpatient Rx  Name  Route  Sig  Dispense  Refill  . omeprazole (PRILOSEC) 40 MG capsule   Oral   Take 1 capsule (40 mg total) by mouth daily.   30 capsule   11   . gabapentin (NEURONTIN) 300 MG capsule   Oral   Take 300 mg by mouth 2 (two) times daily.         Marland Kitchen HYDROcodone-acetaminophen (NORCO/VICODIN) 5-325 MG per tablet   Oral   Take 1 tablet by mouth every 4 (four) hours as needed. For pain         . ibuprofen (ADVIL,MOTRIN) 800 MG tablet   Oral   Take 1 tablet (800 mg total) by mouth 3 (three) times daily.   30 tablet   0   . predniSONE (STERAPRED UNI-PAK) 10 MG tablet   Oral   Take 10 mg by mouth daily.           BP 123/66  Pulse 80  Temp(Src) 98.7 F (37.1 C) (Oral)  Resp 16  SpO2 99%  Physical Exam  Nursing note and vitals reviewed. Constitutional: He is oriented to person, place, and time. He appears well-developed and well-nourished.  Musculoskeletal: He exhibits tenderness.       Left ankle: He exhibits decreased range of motion and swelling. He exhibits no ecchymosis, no deformity and normal pulse. Tenderness. Lateral malleolus tenderness found. No medial malleolus, no head of 5th metatarsal and no proximal fibula tenderness found. Achilles tendon normal.  Neurological: He is alert and oriented to person, place, and time.  Skin: Skin is warm and dry.    ED Course  Procedures (including critical care time)  Labs Reviewed - No data to display Dg Ankle Complete Left  06/08/2012  *RADIOLOGY REPORT*  Clinical Data: Left ankle pain since an injury 4 days ago.  LEFT ANKLE COMPLETE - 3+ VIEW  Comparison: None.  Findings: There is no fracture or dislocation.  There is slight arthritis at the ankle joint.  No joint effusion.  IMPRESSION: No acute abnormality.  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   Original Report  Authenticated By: Francene Boyers, M.D.      1. Sprain of ankle and foot, left, initial encounter       MDM  X-rays reviewed and report per radiologist.           Linna Hoff, MD 06/08/12 1900

## 2013-02-11 ENCOUNTER — Other Ambulatory Visit: Payer: Self-pay | Admitting: Family Medicine

## 2013-02-12 NOTE — Telephone Encounter (Signed)
Medication refilled per protocol. 

## 2013-05-09 ENCOUNTER — Other Ambulatory Visit: Payer: Self-pay | Admitting: Family Medicine

## 2013-05-09 MED ORDER — OMEPRAZOLE 40 MG PO CPDR: DELAYED_RELEASE_CAPSULE | ORAL | Status: DC

## 2013-05-11 ENCOUNTER — Other Ambulatory Visit: Payer: Self-pay | Admitting: Family Medicine

## 2013-05-11 ENCOUNTER — Encounter: Payer: Self-pay | Admitting: Family Medicine

## 2013-05-11 NOTE — Telephone Encounter (Signed)
Refill denied.  Last OV 08/2011.  Letter sent must be seen

## 2014-02-08 ENCOUNTER — Encounter: Payer: Self-pay | Admitting: *Deleted

## 2014-06-17 ENCOUNTER — Ambulatory Visit (INDEPENDENT_AMBULATORY_CARE_PROVIDER_SITE_OTHER): Payer: 59 | Admitting: Family Medicine

## 2014-06-17 ENCOUNTER — Encounter: Payer: Self-pay | Admitting: Family Medicine

## 2014-06-17 VITALS — BP 128/78 | HR 68 | Temp 98.6°F | Resp 18 | Ht 76.0 in | Wt 279.0 lb

## 2014-06-17 DIAGNOSIS — Z0000 Encounter for general adult medical examination without abnormal findings: Secondary | ICD-10-CM | POA: Diagnosis not present

## 2014-06-17 DIAGNOSIS — E669 Obesity, unspecified: Secondary | ICD-10-CM | POA: Diagnosis not present

## 2014-06-17 DIAGNOSIS — F17200 Nicotine dependence, unspecified, uncomplicated: Secondary | ICD-10-CM | POA: Insufficient documentation

## 2014-06-17 LAB — CBC WITH DIFFERENTIAL/PLATELET
Basophils Absolute: 0.1 10*3/uL (ref 0.0–0.1)
Basophils Relative: 1 % (ref 0–1)
Eosinophils Absolute: 0.1 10*3/uL (ref 0.0–0.7)
Eosinophils Relative: 2 % (ref 0–5)
HCT: 47.6 % (ref 39.0–52.0)
Hemoglobin: 16.4 g/dL (ref 13.0–17.0)
LYMPHS ABS: 1.7 10*3/uL (ref 0.7–4.0)
Lymphocytes Relative: 28 % (ref 12–46)
MCH: 31.5 pg (ref 26.0–34.0)
MCHC: 34.5 g/dL (ref 30.0–36.0)
MCV: 91.5 fL (ref 78.0–100.0)
MONOS PCT: 8 % (ref 3–12)
MPV: 11.3 fL (ref 8.6–12.4)
Monocytes Absolute: 0.5 10*3/uL (ref 0.1–1.0)
NEUTROS ABS: 3.7 10*3/uL (ref 1.7–7.7)
Neutrophils Relative %: 61 % (ref 43–77)
PLATELETS: 144 10*3/uL — AB (ref 150–400)
RBC: 5.2 MIL/uL (ref 4.22–5.81)
RDW: 13.5 % (ref 11.5–15.5)
WBC: 6 10*3/uL (ref 4.0–10.5)

## 2014-06-17 LAB — LIPID PANEL
Cholesterol: 261 mg/dL — ABNORMAL HIGH (ref 0–200)
HDL: 34 mg/dL — ABNORMAL LOW (ref >=40)
LDL CALC: 155 mg/dL — AB (ref 0–99)
TRIGLYCERIDES: 358 mg/dL — AB (ref <150)
Total CHOL/HDL Ratio: 7.7 Ratio
VLDL: 72 mg/dL — AB (ref 0–40)

## 2014-06-17 LAB — COMPLETE METABOLIC PANEL WITH GFR
ALBUMIN: 4.4 g/dL (ref 3.5–5.2)
ALK PHOS: 89 U/L (ref 39–117)
ALT: 68 U/L — ABNORMAL HIGH (ref 0–53)
AST: 28 U/L (ref 0–37)
BILIRUBIN TOTAL: 1 mg/dL (ref 0.2–1.2)
BUN: 14 mg/dL (ref 6–23)
CO2: 24 mEq/L (ref 19–32)
Calcium: 9.3 mg/dL (ref 8.4–10.5)
Chloride: 101 mEq/L (ref 96–112)
Creat: 1.01 mg/dL (ref 0.50–1.35)
GFR, Est African American: >89 mL/min
Glucose, Bld: 108 mg/dL — ABNORMAL HIGH (ref 70–99)
POTASSIUM: 4.7 meq/L (ref 3.5–5.3)
SODIUM: 137 meq/L (ref 135–145)
Total Protein: 7 g/dL (ref 6.0–8.3)

## 2014-06-17 MED ORDER — PHENTERMINE HCL 37.5 MG PO CAPS: 37.5000 mg | ORAL_CAPSULE | ORAL | Status: DC

## 2014-06-17 MED ORDER — OMEPRAZOLE 40 MG PO CPDR: DELAYED_RELEASE_CAPSULE | ORAL | Status: DC

## 2014-06-17 NOTE — Progress Notes (Signed)
Subjective:    Patient ID: Sergio Gonzalez, male    DOB: 03/25/1975, 39 y.o.   MRN: [id number]  HPI Patient is here for CPE.  I have not seen the patient in quite some time.  The last time I saw the patient, he did have elevations in his liver function tests. Workup at that time seemed to point to fatty liver disease. Patient also admits that he consumed quite a bit of alcohol. He is overdue for fasting lab work to reassess this. Of note he also has a history of avascular necrosis of the femur. All this possibly points to excessive alcohol consumption. Patient realizes that he is overweight and would like to work towards trying to lose weight. We had a long discussion today about reducing calories. I anticipate based on his reported consumption, the patient is drinking over 3000 cal a week and alcohol, sweet tea, Gatorade, and sodas. I recommended that he discontinue all this immediately and this should help with some weight loss. He is also interested in taking medication to help curb his appetite. Past Medical History  Diagnosis Date  . Avascular necrosis of femur head, right   . GERD (gastroesophageal reflux disease)   . H/O hiatal hernia    Past Surgical History  Procedure Laterality Date  . Tonsillectomy    . Lasic    . Anterior cervical decomp/discectomy fusion  08/20/2011    Procedure: ANTERIOR CERVICAL DECOMPRESSION/DISCECTOMY FUSION 2 LEVELS;  Surgeon: Reinaldo Meekerandy O Kritzer, MD;  Location: [facility];  Service: Neurosurgery;  Laterality: Bilateral;  Cervical five-six, six-seven anterior cervical discectomy with fusion  . Eye surgery    . Spine surgery     Current Outpatient Prescriptions on File Prior to Visit  Medication Sig Dispense Refill  . omeprazole (PRILOSEC) 40 MG capsule TAKE 1 CAPSULE BY MOUTH DAILY 30 capsule 11   No current facility-administered medications on file prior to visit.   No Known Allergies History   Social History  . Marital Status: Married    Spouse Name:  N/A  . Number of Children: N/A  . Years of Education: 618yr [employer]   Occupational History  . farm work     Social History Main Topics  . Smoking status: Current Some Day Smoker -- 0.25 packs/day    Types: Cigarettes  . Smokeless tobacco: Never Used  . Alcohol Use: 7.2 oz/week    12 Cans of beer per week  . Drug Use: No  . Sexual Activity: Yes   Other Topics Concern  . Not on file   Social History Narrative   Family History  Problem Relation Age of Onset  . Cancer      family history   . Diabetes      family history   . Cancer Mother   . Cancer Maternal Grandfather   . Cancer Paternal Grandfather      Review of Systems  All other systems reviewed and are negative.      Objective:   Physical Exam  Constitutional: He is oriented to person, place, and time. He appears well-developed and well-nourished. No distress.  HENT:  Head: Normocephalic and atraumatic.  Right Ear: External ear normal.  Left Ear: External ear normal.  Nose: Nose normal.  Mouth/Throat: Oropharynx is clear and moist. No oropharyngeal exudate.  Eyes: Conjunctivae and EOM are normal. Pupils are equal, round, and reactive to light. Right eye exhibits no discharge. Left eye exhibits no discharge. No scleral icterus.  Neck: Normal range  of motion. Neck supple. No JVD present. No tracheal deviation present. No thyromegaly present.  Cardiovascular: Normal rate, regular rhythm, normal heart sounds and intact distal pulses.  Exam reveals no gallop and no friction rub.   No murmur heard. Pulmonary/Chest: Effort normal and breath sounds normal. No stridor. No respiratory distress. He has no wheezes. He has no rales. He exhibits no tenderness.  Abdominal: Soft. Bowel sounds are normal. He exhibits no distension and no mass. There is no tenderness. There is no rebound and no guarding.  Musculoskeletal: Normal range of motion. He exhibits no edema or tenderness.  Lymphadenopathy:    He has no cervical adenopathy.    Neurological: He is alert and oriented to person, place, and time. He has normal reflexes. He displays normal reflexes. No cranial nerve deficit. He exhibits normal muscle tone. Coordination normal.  Skin: Skin is warm. No rash noted. He is not diaphoretic. No erythema. No pallor.  Psychiatric: He has a normal mood and affect. His behavior is normal. Judgment and thought content normal.  Vitals reviewed.         Assessment & Plan:  Routine general medical examination at a health care facility - Plan: COMPLETE METABOLIC PANEL WITH GFR, CBC with Differential/Platelet, Lipid panel  I recommended cessation of tobacco use and alcohol intake. I recommended decreasing his calorie intake to 1500 cal a day. I recommended giving up all alcohol, sweet tea, soda, and Gatorade. We will also try phentermine 37.5 mg by mouth every morning as an appetite suppressant for 3 months. I warned the patient about cardiovascular risk factors with his medication. He is comfortable trying the medication. I will also check a CBC, CMP, and a fasting lipid panel.

## 2014-06-19 ENCOUNTER — Telehealth: Payer: Self-pay | Admitting: Family Medicine

## 2014-06-19 ENCOUNTER — Other Ambulatory Visit: Payer: Self-pay | Admitting: Family Medicine

## 2014-06-19 MED ORDER — ATORVASTATIN CALCIUM 20 MG PO TABS: 20.0000 mg | ORAL_TABLET | Freq: Every day | ORAL | Status: DC

## 2014-06-19 NOTE — Telephone Encounter (Signed)
Patient states got lab results, but forgot to get what the numbers were. [phone]

## 2014-06-20 NOTE — Telephone Encounter (Signed)
Called pt back and made aware of numbers for cholesterol and BS

## 2014-07-11 ENCOUNTER — Encounter: Payer: Self-pay | Admitting: Family Medicine

## 2014-07-11 ENCOUNTER — Ambulatory Visit (INDEPENDENT_AMBULATORY_CARE_PROVIDER_SITE_OTHER): Payer: 59 | Admitting: Family Medicine

## 2014-07-11 VITALS — BP 118/82 | HR 78 | Temp 98.2°F | Resp 18 | Ht 76.0 in | Wt 264.0 lb

## 2014-07-11 DIAGNOSIS — R300 Dysuria: Secondary | ICD-10-CM

## 2014-07-11 LAB — URINALYSIS, ROUTINE W REFLEX MICROSCOPIC
Glucose, UA: NEGATIVE mg/dL
HGB URINE DIPSTICK: NEGATIVE
Ketones, ur: NEGATIVE mg/dL
Leukocytes, UA: NEGATIVE
NITRITE: NEGATIVE
PH: 7 (ref 5.0–8.0)
PROTEIN: 30 mg/dL — AB
SPECIFIC GRAVITY, URINE: 1.02 (ref 1.005–1.030)
UROBILINOGEN UA: 1 mg/dL (ref 0.0–1.0)

## 2014-07-11 LAB — URINALYSIS, MICROSCOPIC ONLY
Bacteria, UA: NONE SEEN
CASTS: NONE SEEN
Crystals: NONE SEEN
RBC / HPF: NONE SEEN RBC/hpf (ref <3)
SQUAMOUS EPITHELIAL / LPF: NONE SEEN

## 2014-07-11 MED ORDER — DOXYCYCLINE HYCLATE 100 MG PO TABS: 100.0000 mg | ORAL_TABLET | Freq: Two times a day (BID) | ORAL | Status: DC

## 2014-07-11 NOTE — Progress Notes (Signed)
Subjective:    Patient ID: Sergio Gonzalez, male    DOB: 06/28/1975, 39 y.o.   MRN: [id number]  HPI Patient is very pleasant 39 year old white male who is happily married. He states that there is no chance he has been exposed to gonorrhea or chlamydia. However over the last 2 or 3 days he complains of burning upon urination.  Urinalysis today is completely normal however his urine is extremely dark in color. Patient has lost 15 pounds over the last 3 weeks. He is rapidly trying to lose weight due to his most recent physical exam here. He is also working out in the hot sun and sweating profusely. I believe the patient is severely dehydrated and this is likely causing the burning with urination. He states that he has no concern about possible exposure to STI such as gonorrhea Chlamydia or Trichomonas.  He has been completely faithful to his wife and he has no concern that she has been  Unfaithful. Past Medical History  Diagnosis Date  . Avascular necrosis of femur head, right   . GERD (gastroesophageal reflux disease)   . H/O hiatal hernia   . Smoker    Past Surgical History  Procedure Laterality Date  . Tonsillectomy    . Lasic    . Anterior cervical decomp/discectomy fusion  08/20/2011    Procedure: ANTERIOR CERVICAL DECOMPRESSION/DISCECTOMY FUSION 2 LEVELS;  Surgeon: [name], MD;  Location: [facility];  Service: Neurosurgery;  Laterality: Bilateral;  Cervical five-six, six-seven anterior cervical discectomy with fusion  . Eye surgery    . Spine surgery     Current Outpatient Prescriptions on File Prior to Visit  Medication Sig Dispense Refill  . atorvastatin (LIPITOR) 20 MG tablet Take 1 tablet (20 mg total) by mouth daily. 30 tablet 3  . omeprazole (PRILOSEC) 40 MG capsule TAKE 1 CAPSULE BY MOUTH DAILY 30 capsule 11  . phentermine 37.5 MG capsule Take 1 capsule (37.5 mg total) by mouth every morning. 30 capsule 2   No current facility-administered medications on file prior to  visit.   No Known Allergies History   Social History  . Marital Status: Married    Spouse Name: N/A  . Number of Children: N/A  . Years of Education: 19yr [employer]   Occupational History  . farm work     Social History Main Topics  . Smoking status: Current Some Day Smoker -- 0.25 packs/day    Types: Cigarettes  . Smokeless tobacco: Never Used  . Alcohol Use: 7.2 oz/week    12 Cans of beer per week  . Drug Use: No  . Sexual Activity: Yes   Other Topics Concern  . Not on file   Social History Narrative   Family History  Problem Relation Age of Onset  . Cancer      family history   . Diabetes      family history   . Cancer Mother   . Cancer Maternal Grandfather   . Cancer Paternal Grandfather      Review of Systems  All other systems reviewed and are negative.      Objective:   Physical Exam  Constitutional: He appears well-developed and well-nourished. No distress.  Cardiovascular: Normal rate, regular rhythm, normal heart sounds and intact distal pulses.   No murmur heard. Pulmonary/Chest: Effort normal and breath sounds normal. No respiratory distress. He has no wheezes. He has no rales. He exhibits no tenderness.  Abdominal: Soft. Bowel sounds are normal. He  exhibits no distension and no mass. There is no tenderness. There is no rebound and no guarding.  Musculoskeletal: He exhibits no edema.  Skin: He is not diaphoretic.  Vitals reviewed.  On examination the glans of the penis shows no erythema. There is no evidence  Of urethral trauma. There is no evidence of penile discharge or balanitis.  Patient has no testicular pain.       Assessment & Plan:  Burning with urination - Plan: Urinalysis, Routine w reflex microscopic (not at [facility]), doxycycline (VIBRA-TABS) 100 MG tablet  I believe the burning with urination is likely due to dehydration and I have recommended the patient increase his fluid intake substantially and drink lots of water and Gatorade. If  symptoms improve over the next 2 days no further workup is necessary. If symptoms persist I would like the patient's take doxycycline 100 mg by mouth twice a day for possible urethritis and in follow-up if no better.

## 2014-09-15 ENCOUNTER — Other Ambulatory Visit: Payer: Self-pay | Admitting: Family Medicine

## 2014-09-16 NOTE — Telephone Encounter (Signed)
Denied, should not take longer than 3 months.

## 2014-09-16 NOTE — Telephone Encounter (Signed)
Refill denied 

## 2014-09-16 NOTE — Telephone Encounter (Signed)
Ok to refill??  Last office visit 07/11/2014.  Last refill 06/17/2014, #2 refills.

## 2014-10-01 ENCOUNTER — Telehealth: Payer: Self-pay | Admitting: Family Medicine

## 2014-10-01 NOTE — Telephone Encounter (Signed)
Patient is calling to to say that the lipitor is causing some side effects and would like to discuss this with you  [phone]

## 2014-10-02 NOTE — Telephone Encounter (Signed)
Spoke to pt and he states that all summer he has been taking the Lipitor and he has had excessive sweating, HA's and dry mouth and in general just not feeling well. When he does not take it he does NOT have those sxs. And was wondering if there was something else he could take that would not give him those sxs?

## 2014-10-03 MED ORDER — PRAVASTATIN SODIUM 20 MG PO TABS: 20.0000 mg | ORAL_TABLET | Freq: Every day | ORAL | Status: DC

## 2014-10-03 NOTE — Telephone Encounter (Signed)
He could try pravastatin 20 mg poqday and see if he tolerates it better.

## 2014-10-03 NOTE — Telephone Encounter (Signed)
Pt aware and med sent to pharm 

## 2015-02-22 ENCOUNTER — Other Ambulatory Visit: Payer: Self-pay | Admitting: Family Medicine

## 2015-03-31 ENCOUNTER — Encounter: Payer: Self-pay | Admitting: Physician Assistant

## 2015-03-31 ENCOUNTER — Other Ambulatory Visit: Payer: Self-pay | Admitting: Family Medicine

## 2015-03-31 ENCOUNTER — Ambulatory Visit (INDEPENDENT_AMBULATORY_CARE_PROVIDER_SITE_OTHER): Payer: 59 | Admitting: Physician Assistant

## 2015-03-31 ENCOUNTER — Encounter: Payer: Self-pay | Admitting: Family Medicine

## 2015-03-31 VITALS — BP 128/86 | HR 96 | Temp 98.9°F | Resp 20 | Wt 259.0 lb

## 2015-03-31 DIAGNOSIS — R6883 Chills (without fever): Secondary | ICD-10-CM

## 2015-03-31 DIAGNOSIS — A084 Viral intestinal infection, unspecified: Secondary | ICD-10-CM | POA: Diagnosis not present

## 2015-03-31 LAB — INFLUENZA A AND B AG, IMMUNOASSAY
INFLUENZA A ANTIGEN: NOT DETECTED
Influenza B Antigen: NOT DETECTED

## 2015-03-31 MED ORDER — PRAVASTATIN SODIUM 20 MG PO TABS: 20.0000 mg | ORAL_TABLET | Freq: Every day | ORAL | Status: DC

## 2015-03-31 NOTE — Progress Notes (Signed)
Patient ID: KATHRYN LINAREZ MRN: [id number], DOB: 03/08/1975, 40 y.o. Date of Encounter: 03/31/2015, 11:28 AM    Chief Complaint:  Chief Complaint  Patient presents with  . sick x 1 days    n/v/d   feels awful     HPI: 40 y.o. year old white male presents with above.   He states that  "this hit him all of a sudden" late yesterday afternoon. Says that last night, every time he sat up or stood up, he felt very dizzy and nauseous. Would have to lie back down. Says that he also was "running to the bathroom all night " He vomited 2 times--- once around 10 PM and once around 1 AM. Says he has had diarrhea about 10 times since this started yesterday afternoon. His last episode of diarrhea was right before coming here.  Says that today he does not feel as bad as he did. Not feeling dizzy and nauseus every time he is upright.  Has not checked his temperature.  Has had no known fever. At home he lives with his wife, 52-year-old daughter, and 55 year old grandmother. States that none of them have been sick with similar symptoms. Has had no one, focal, localized area of abdominal pain.    Home Meds:   Outpatient Prescriptions Prior to Visit  Medication Sig Dispense Refill  . omeprazole (PRILOSEC) 40 MG capsule TAKE 1 CAPSULE BY MOUTH DAILY 30 capsule 11  . pravastatin (PRAVACHOL) 20 MG tablet Take 1 tablet (20 mg total) by mouth daily. (Needs office visit and labs before further refills) 30 tablet 0  . phentermine 37.5 MG capsule Take 1 capsule (37.5 mg total) by mouth every morning. (Patient not taking: Reported on 03/31/2015) 30 capsule 2  . doxycycline (VIBRA-TABS) 100 MG tablet Take 1 tablet (100 mg total) by mouth 2 (two) times daily. 20 tablet 0   No facility-administered medications prior to visit.    Allergies: No Known Allergies    Review of Systems: See HPI for pertinent ROS. All other ROS negative.    Physical Exam: Blood pressure 128/86, pulse 96, temperature 98.9 F (37.2  C), temperature source Oral, resp. rate 20, weight 259 lb (117.482 kg)., Body mass index is 31.54 kg/(m^2). General:  WNWD WM. Appears in no acute distress. Neck: Supple. No thyromegaly. No lymphadenopathy. Lungs: Clear bilaterally to auscultation without wheezes, rales, or rhonchi. Breathing is unlabored. Heart: Regular rhythm. No murmurs, rubs, or gallops. Abdomen: Soft, non-tender, non-distended with normoactive bowel sounds. No hepatomegaly. No rebound/guarding. No obvious abdominal masses. Minimal tenderness with palpation of epigastric region. No other area of tenderness with palpation.  Msk:  Strength and tone normal for age. Extremities/Skin: Warm and dry.  Neuro: Alert and oriented X 3. Moves all extremities spontaneously. Gait is normal. CNII-XII grossly in tact. Psych:  Responds to questions appropriately with a normal affect.   Results for orders placed or performed in visit on 03/31/15  Influenza A and B Ag, Immunoassay  Result Value Ref Range   Source: NASAL    Influenza A Antigen Not Detected Not Detected   Influenza B Antigen Not Detected Not Detected     ASSESSMENT AND PLAN:  40 y.o. year old male with  1. Viral gastroenteritis Clear liquid diet with ginger ale and chicken noodle soup. As symptoms improve, can advance to very bland diet. Follow up if symptoms worsen or if develops signs/symptoms of dehydration. Follow-up if not resolving in 72 hours. Note given out of work [employer]  2/27 - Wed 3/1. If unable to return to work on Thursday 3/2, then call for follow-up. 2. Chills - Influenza A and B Ag, Immunoassay   Signed, [address] [place], [place], [facility] 03/31/2015 11:28 AM

## 2015-04-28 ENCOUNTER — Ambulatory Visit (INDEPENDENT_AMBULATORY_CARE_PROVIDER_SITE_OTHER): Payer: 59 | Admitting: Family Medicine

## 2015-04-28 ENCOUNTER — Encounter: Payer: Self-pay | Admitting: Family Medicine

## 2015-04-28 VITALS — BP 124/88 | HR 84 | Temp 98.2°F | Resp 16 | Ht 76.0 in | Wt 263.0 lb

## 2015-04-28 DIAGNOSIS — K760 Fatty (change of) liver, not elsewhere classified: Secondary | ICD-10-CM | POA: Diagnosis not present

## 2015-04-28 DIAGNOSIS — E785 Hyperlipidemia, unspecified: Secondary | ICD-10-CM | POA: Diagnosis not present

## 2015-04-28 LAB — COMPLETE METABOLIC PANEL WITH GFR
ALK PHOS: 84 U/L (ref 40–115)
ALT: 35 U/L (ref 9–46)
AST: 19 U/L (ref 10–40)
Albumin: 4.5 g/dL (ref 3.6–5.1)
BUN: 12 mg/dL (ref 7–25)
CO2: 23 mmol/L (ref 20–31)
Calcium: 8.8 mg/dL (ref 8.6–10.3)
Chloride: 102 mmol/L (ref 98–110)
Creat: 0.96 mg/dL (ref 0.60–1.35)
GLUCOSE: 92 mg/dL (ref 70–99)
POTASSIUM: 3.9 mmol/L (ref 3.5–5.3)
SODIUM: 138 mmol/L (ref 135–146)
Total Bilirubin: 0.7 mg/dL (ref 0.2–1.2)
Total Protein: 6.7 g/dL (ref 6.1–8.1)

## 2015-04-28 LAB — LIPID PANEL
CHOL/HDL RATIO: 4.1 ratio (ref <=5.0)
Cholesterol: 200 mg/dL (ref 125–200)
HDL: 49 mg/dL (ref >=40)
LDL Cholesterol: 94 mg/dL (ref <130)
TRIGLYCERIDES: 283 mg/dL — AB (ref <150)
VLDL: 57 mg/dL — ABNORMAL HIGH (ref <30)

## 2015-04-28 NOTE — Progress Notes (Signed)
Subjective:    Patient ID: Sergio Gonzalez, male    DOB: 08-Apr-1975, 40 y.o.   MRN: [id number]  HPI  Patient has a history of HLD and is currently on pravastatin 20 mg poqday.  He also has a history of fatty liver disease and we have been encouraging him to avoid alcohol and recommending diet/exercise/weight loss.  He is here today for follow up. The patient has been able to maintain his weight around 263 pounds. His blood pressure is good today. Unfortunately he continues to drink 1-2 days a week, approximately 12 beers at a time. He also smokes occasionally. He states that a pack of cigarettes may last 2 weeks. He denies any chest pain, shortness of breath, dyspnea on exertion he denies any myalgias or right upper quadrant pain. He denies any hematochezia or black tarry stools or abdominal pain he is interested in taking phentermine again to help lose weight. Past Medical History  Diagnosis Date  . Avascular necrosis of femur head, right (HCC)   . GERD (gastroesophageal reflux disease)   . H/O hiatal hernia   . Smoker   . Hyperlipidemia    Past Surgical History  Procedure Laterality Date  . Tonsillectomy    . Lasic    . Anterior cervical decomp/discectomy fusion  08/20/2011    Procedure: ANTERIOR CERVICAL DECOMPRESSION/DISCECTOMY FUSION 2 LEVELS;  Surgeon: [name], MD;  Location: [facility];  Service: Neurosurgery;  Laterality: Bilateral;  Cervical five-six, six-seven anterior cervical discectomy with fusion  . Eye surgery    . Spine surgery     Current Outpatient Prescriptions on File Prior to Visit  Medication Sig Dispense Refill  . omeprazole (PRILOSEC) 40 MG capsule TAKE 1 CAPSULE BY MOUTH DAILY 30 capsule 11  . phentermine 37.5 MG capsule Take 1 capsule (37.5 mg total) by mouth every morning. (Patient not taking: Reported on 03/31/2015) 30 capsule 2  . pravastatin (PRAVACHOL) 20 MG tablet Take 1 tablet (20 mg total) by mouth daily. (Needs office visit and labs before  further refills) 30 tablet 0   No current facility-administered medications on file prior to visit.   No Known Allergies Social History   Social History  . Marital Status: Married    Spouse Name: N/A  . Number of Children: N/A  . Years of Education: 46yr [employer]   Occupational History  . farm work     Social History Main Topics  . Smoking status: Current Some Day Smoker -- 0.25 packs/day    Types: Cigarettes  . Smokeless tobacco: Never Used  . Alcohol Use: 7.2 oz/week    12 Cans of beer per week  . Drug Use: No  . Sexual Activity: Yes   Other Topics Concern  . Not on file   Social History Narrative      Review of Systems  All other systems reviewed and are negative.      Objective:   Physical Exam  Constitutional: He appears well-developed and well-nourished.  Cardiovascular: Normal rate, regular rhythm and normal heart sounds.   Pulmonary/Chest: Effort normal and breath sounds normal. No respiratory distress. He has no wheezes. He has no rales.  Abdominal: Soft. Bowel sounds are normal. He exhibits no distension. There is no tenderness. There is no rebound.  Musculoskeletal: He exhibits no edema.  Vitals reviewed.         Assessment & Plan:  HLD (hyperlipidemia) - Plan: COMPLETE METABOLIC PANEL WITH GFR, Lipid panel  Fatty liver disease, nonalcoholic -  Plan: COMPLETE METABOLIC PANEL WITH GFR, Lipid panel The patient works hard as a Visual merchandiserfarmer. I'm very concerned about him taking phentermine, during the hot summer with strenuous physical exertion. I recommended against this. Also recommended discontinuation of alcohol and tobacco abuse. I would like to check a fasting lipid panel along with a CMP today. His blood pressure is excellent. His goal LDL cholesterol be less than 130.  His liver tests may be slightly elevated today as the patient missed that he has not been very active over the winter time and he has been drinking a little bit more.

## 2015-04-30 ENCOUNTER — Encounter: Payer: Self-pay | Admitting: Family Medicine

## 2015-05-02 ENCOUNTER — Other Ambulatory Visit: Payer: Self-pay | Admitting: Family Medicine

## 2015-05-02 NOTE — Telephone Encounter (Signed)
Refill appropriate and filled per protocol. 

## 2015-06-14 ENCOUNTER — Other Ambulatory Visit: Payer: Self-pay | Admitting: Family Medicine

## 2015-06-17 ENCOUNTER — Emergency Department
Admission: EM | Admit: 2015-06-17 | Discharge: 2015-06-17 | Disposition: A | Discharge status: Home / Self Care | Payer: 59 | Attending: Emergency Medicine | Admitting: Emergency Medicine

## 2015-06-17 ENCOUNTER — Emergency Department: Payer: 59

## 2015-06-17 ENCOUNTER — Encounter: Payer: Self-pay | Admitting: Emergency Medicine

## 2015-06-17 DIAGNOSIS — M10072 Idiopathic gout, left ankle and foot: Secondary | ICD-10-CM | POA: Diagnosis not present

## 2015-06-17 DIAGNOSIS — E785 Hyperlipidemia, unspecified: Secondary | ICD-10-CM | POA: Insufficient documentation

## 2015-06-17 DIAGNOSIS — F17210 Nicotine dependence, cigarettes, uncomplicated: Secondary | ICD-10-CM | POA: Diagnosis not present

## 2015-06-17 DIAGNOSIS — M79672 Pain in left foot: Secondary | ICD-10-CM | POA: Diagnosis present

## 2015-06-17 LAB — URIC ACID: URIC ACID, SERUM: 8.2 mg/dL — AB (ref 4.4–7.6)

## 2015-06-17 MED ORDER — DICLOFENAC SODIUM 75 MG PO TBEC: 75.0000 mg | DELAYED_RELEASE_TABLET | Freq: Two times a day (BID) | ORAL | Status: DC

## 2015-06-17 MED ORDER — HYDROCODONE-ACETAMINOPHEN 5-325 MG PO TABS
1.0000 | ORAL_TABLET | Freq: Once | ORAL | Status: AC
  Administered 2015-06-17: 1 via ORAL
  Filled 2015-06-17: qty 1

## 2015-06-17 MED ORDER — HYDROCODONE-ACETAMINOPHEN 5-325 MG PO TABS: 1.0000 | ORAL_TABLET | Freq: Four times a day (QID) | ORAL | Status: DC | PRN

## 2015-06-17 NOTE — ED Notes (Signed)
Reports pain on top of left foot and ankle.  Denies injury. +PMS

## 2015-06-17 NOTE — ED Notes (Signed)
See triage noted  States developed pain to left foot couple of days ago  States pain woke him up   Min swelling   No injury  posiitve pulses

## 2015-06-17 NOTE — Discharge Instructions (Signed)
Gout °Gout is an inflammatory arthritis caused by a buildup of uric acid crystals in the joints. Uric acid is a chemical that is normally present in the blood. When the level of uric acid in the blood is too high it can form crystals that deposit in your joints and tissues. This causes joint redness, soreness, and swelling (inflammation). Repeat attacks are common. Over time, uric acid crystals can form into masses (tophi) near a joint, destroying bone and causing disfigurement. Gout is treatable and often preventable. °CAUSES  °The disease begins with elevated levels of uric acid in the blood. Uric acid is produced by your body when it breaks down a naturally found substance called purines. Certain foods you eat, such as meats and fish, contain high amounts of purines. Causes of an elevated uric acid level include: °· Being passed down from parent to child (heredity). °· Diseases that cause increased uric acid production (such as obesity, psoriasis, and certain cancers). °· Excessive alcohol use. °· Diet, especially diets rich in meat and seafood. °· Medicines, including certain cancer-fighting medicines (chemotherapy), water pills (diuretics), and aspirin. °· Chronic kidney disease. The kidneys are no longer able to remove uric acid well. °· Problems with metabolism. °Conditions strongly associated with gout include: °· Obesity. °· High blood pressure. °· High cholesterol. °· Diabetes. °Not everyone with elevated uric acid levels gets gout. It is not understood why some people get gout and others do not. Surgery, joint injury, and eating too much of certain foods are some of the factors that can lead to gout attacks. °SYMPTOMS  °· An attack of gout comes on quickly. It causes intense pain with redness, swelling, and warmth in a joint. °· Fever can occur. °· Often, only one joint is involved. Certain joints are more commonly involved: °· Base of the big toe. °· Knee. °· Ankle. °· Wrist. °· Finger. °Without  treatment, an attack usually goes away in a few days to weeks. Between attacks, you usually will not have symptoms, which is different from many other forms of arthritis. °DIAGNOSIS  °Your caregiver will suspect gout based on your symptoms and exam. In some cases, tests may be recommended. The tests may include: °· Blood tests. °· Urine tests. °· X-rays. °· Joint fluid exam. This exam requires a needle to remove fluid from the joint (arthrocentesis). Using a microscope, gout is confirmed when uric acid crystals are seen in the joint fluid. °TREATMENT  °There are two phases to gout treatment: treating the sudden onset (acute) attack and preventing attacks (prophylaxis). °· Treatment of an Acute Attack. °· Medicines are used. These include anti-inflammatory medicines or steroid medicines. °· An injection of steroid medicine into the affected joint is sometimes necessary. °· The painful joint is rested. Movement can worsen the arthritis. °· You may use warm or cold treatments on painful joints, depending which works best for you. °· Treatment to Prevent Attacks. °· If you suffer from frequent gout attacks, your caregiver may advise preventive medicine. These medicines are started after the acute attack subsides. These medicines either help your kidneys eliminate uric acid from your body or decrease your uric acid production. You may need to stay on these medicines for a very long time. °· The early phase of treatment with preventive medicine can be associated with an increase in acute gout attacks. For this reason, during the first few months of treatment, your caregiver may also advise you to take medicines usually used for acute gout treatment. Be sure you   understand your caregiver's directions. Your caregiver may make several adjustments to your medicine dose before these medicines are effective.  Discuss dietary treatment with your caregiver or dietitian. Alcohol and drinks high in sugar and fructose and foods  such as meat, poultry, and seafood can increase uric acid levels. Your caregiver or dietitian can advise you on drinks and foods that should be limited. HOME CARE INSTRUCTIONS   Do not take aspirin to relieve pain. This raises uric acid levels.  Only take over-the-counter or prescription medicines for pain, discomfort, or fever as directed by your caregiver.  Rest the joint as much as possible. When in bed, keep sheets and blankets off painful areas.  Keep the affected joint raised (elevated).  Apply warm or cold treatments to painful joints. Use of warm or cold treatments depends on which works best for you.  Use crutches if the painful joint is in your leg.  Drink enough fluids to keep your urine clear or pale yellow. This helps your body get rid of uric acid. Limit alcohol, sugary drinks, and fructose drinks.  Follow your dietary instructions. Pay careful attention to the amount of protein you eat. Your daily diet should emphasize fruits, vegetables, whole grains, and fat-free or low-fat milk products. Discuss the use of coffee, vitamin C, and cherries with your caregiver or dietitian. These may be helpful in lowering uric acid levels.  Maintain a healthy body weight. SEEK MEDICAL CARE IF:   You develop diarrhea, vomiting, or any side effects from medicines.  You do not feel better in 24 hours, or you are getting worse. SEEK IMMEDIATE MEDICAL CARE IF:   Your joint becomes suddenly more tender, and you have chills or a fever. MAKE SURE YOU:   Understand these instructions.  Will watch your condition.  Will get help right away if you are not doing well or get worse.   This information is not intended to replace advice given to you by your health care provider. Make sure you discuss any questions you have with your health care provider.   Document Released: 01/16/2000 Document Revised: 02/08/2014 Document Reviewed: 09/01/2011 Elsevier Interactive Patient Education 2016  [place] are compounds that affect the level of uric acid in your body. A low-purine diet is a diet that is low in purines. Eating a low-purine diet can prevent the level of uric acid in your body from getting too high and causing gout or kidney stones or both. WHAT DO I NEED TO KNOW ABOUT THIS DIET?  Choose low-purine foods. Examples of low-purine foods are listed in the next section.  Drink plenty of fluids, especially water. Fluids can help remove uric acid from your body. Try to drink 8-16 cups (1.9-3.8 L) a day.  Limit foods high in fat, especially saturated fat, as fat makes it harder for the body to get rid of uric acid. Foods high in saturated fat include pizza, cheese, ice cream, whole milk, fried foods, and gravies. Choose foods that are lower in fat and lean sources of protein. Use olive oil when cooking as it contains healthy fats that are not high in saturated fat.  Limit alcohol. Alcohol interferes with the elimination of uric acid from your body. If you are having a gout attack, avoid all alcohol.  Keep in mind that different people's bodies react differently to different foods. You will probably learn over time which foods do or do not affect you. If you discover that a food tends to  cause your gout to flare up, avoid eating that food. You can more freely enjoy foods that do not cause problems. If you have any questions about a food item, talk to your dietitian or health care provider. WHICH FOODS ARE LOW, MODERATE, AND HIGH IN PURINES? The following is a list of foods that are low, moderate, and high in purines. You can eat any amount of the foods that are low in purines. You may be able to have small amounts of foods that are moderate in purines. Ask your health care provider how much of a food moderate in purines you can have. Avoid foods high in purines. Grains  Foods low in purines: Enriched white bread, pasta, rice, cake, cornbread,  popcorn.  Foods moderate in purines: Whole-grain breads and cereals, wheat germ, bran, oatmeal. Uncooked oatmeal. Dry wheat bran or wheat germ.  Foods high in purines: Pancakes, [place] toast, biscuits, muffins. Vegetables  Foods low in purines: All vegetables, except those that are moderate in purines.  Foods moderate in purines: Asparagus, cauliflower, spinach, mushrooms, green peas. Fruits  All fruits are low in purines. Meats and other Protein Foods  Foods low in purines: Eggs, nuts, peanut butter.  Foods moderate in purines: 80-90% lean beef, lamb, veal, pork, poultry, fish, eggs, peanut butter, nuts. Crab, lobster, oysters, and shrimp. Cooked dried beans, peas, and lentils.  Foods high in purines: Anchovies, sardines, herring, mussels, tuna, codfish, scallops, trout, and haddock. Tomasa BlaseBacon. Organ meats (such as liver or kidney). Tripe. Game meat. Goose. Sweetbreads. Dairy  All dairy foods are low in purines. Low-fat and fat-free dairy products are best because they are low in saturated fat. Beverages  Drinks low in purines: Water, carbonated beverages, tea, coffee, cocoa.  Drinks moderate in purines: Soft drinks and other drinks sweetened with high-fructose corn syrup. Juices. To find whether a food or drink is sweetened with high-fructose corn syrup, look at the ingredients list.  Drinks high in purines: Alcoholic beverages (such as beer). Condiments  Foods low in purines: Salt, herbs, olives, pickles, relishes, vinegar.  Foods moderate in purines: Butter, margarine, oils, mayonnaise. Fats and Oils  Foods low in purines: All types, except gravies and sauces made with meat.  Foods high in purines: Gravies and sauces made with meat. Other Foods  Foods low in purines: Sugars, sweets, gelatin. Cake. Soups made without meat.  Foods moderate in purines: Meat-based or fish-based soups, broths, or bouillons. Foods and drinks sweetened with high-fructose corn syrup.  Foods high  in purines: High-fat desserts (such as ice cream, cookies, cakes, pies, doughnuts, and chocolate). Contact your dietitian for more information on foods that are not listed here.   This information is not intended to replace advice given to you by your health care provider. Make sure you discuss any questions you have with your health care provider.   Document Released: 05/15/2010 Document Revised: 01/23/2013 Document Reviewed: 12/25/2012 Elsevier Interactive Patient Education [employer]2016 Elsevier Inc.   Follow-up with your provider for ongoing symptom management. Take the prescription meds as directed.

## 2015-06-17 NOTE — ED Provider Notes (Signed)
[facility] Emergency Department Provider Note ____________________________________________  Time seen: [id number]  I have reviewed the triage vital signs and the nursing notes.  HISTORY  Chief Complaint  Foot Pain  HPI Sergio Gonzalez is a 40 y.o. male to the ED for evaluation of left foot pain with sudden onset 2 days prior to arrival. He claims he awoke with the pain to his dorsal left foot without known injury, accident, or trauma. He gives a history of remote foot fracture that was treated several years ago a walking boot. He denies any history of gout, arthritis, or foot swelling. He describes the pain as burning and achy at times. He is unable to wear his sock due to the exquisite nature of the pain. He also notes pain is increased with weightbearing and pulling up the toes. He denies any calf or Achilles tenderness, shortness of breath, or chest pain. He reports his pain at 8/10 in triage.   Past Medical History  Diagnosis Date  . Avascular necrosis of femur head, right (HCC)   . GERD (gastroesophageal reflux disease)   . H/O hiatal hernia   . Smoker   . Hyperlipidemia     Patient Active Problem List   Diagnosis Date Noted  . Smoker   . CLOSED FRACTURE OF LATERAL MALLEOLUS 03/04/2010  . HIP PAIN 04/18/2007    Past Surgical History  Procedure Laterality Date  . Tonsillectomy    . Lasic    . Anterior cervical decomp/discectomy fusion  08/20/2011    Procedure: ANTERIOR CERVICAL DECOMPRESSION/DISCECTOMY FUSION 2 LEVELS;  Surgeon: Reinaldo Meekerandy O Kritzer, MD;  Location: [facility];  Service: Neurosurgery;  Laterality: Bilateral;  Cervical five-six, six-seven anterior cervical discectomy with fusion  . Eye surgery    . Spine surgery      Current Outpatient Rx  Name  Route  Sig  Dispense  Refill  . diclofenac (VOLTAREN) 75 MG EC tablet   Oral   Take 1 tablet (75 mg total) by mouth 2 (two) times daily.   20 tablet   0   . HYDROcodone-acetaminophen (NORCO)  5-325 MG tablet   Oral   Take 1 tablet by mouth every 6 (six) hours as needed for moderate pain.   10 tablet   0   . omeprazole (PRILOSEC) 40 MG capsule      TAKE 1 CAPSULE BY MOUTH DAILY   30 capsule   11   . pravastatin (PRAVACHOL) 20 MG tablet      TAKE 1 TABLET(20 MG) BY MOUTH DAILY   30 tablet   6    Allergies Review of patient's allergies indicates no known allergies.  Family History  Problem Relation Age of Onset  . Cancer      family history   . Diabetes      family history   . Cancer Mother   . Cancer Maternal Grandfather   . Cancer Paternal Grandfather    Social History Social History  Substance Use Topics  . Smoking status: Current Some Day Smoker -- 0.25 packs/day    Types: Cigarettes  . Smokeless tobacco: Never Used  . Alcohol Use: 7.2 oz/week    12 Cans of beer per week   Review of Systems  Constitutional: Negative for fever. Musculoskeletal: Negative for back pain. Reports left foot pain as above Skin: Negative for rash. Neurological: Negative for headaches, focal weakness or numbness. ____________________________________________  PHYSICAL EXAM:  VITAL SIGNS: ED Triage Vitals  Enc Vitals Group  BP 06/17/15 1400 142/91 mmHg     Pulse Rate 06/17/15 1400 82     Resp 06/17/15 1400 20     Temp 06/17/15 1400 97.6 F (36.4 C)     Temp Source 06/17/15 1400 Oral     SpO2 06/17/15 1400 98 %     Weight 06/17/15 1400 265 lb (120.203 kg)     Height 06/17/15 1400  (1.93 m)     Head Cir --      Peak Flow --      Pain Score 06/17/15 1359 8     Pain Loc --      Pain Edu? --      Excl. in GC? --    Constitutional: Alert and oriented. Well appearing and in no distress. Head: Normocephalic and atraumatic. Cardiovascular: Normal rate, regular rhythm. Normal distal pulses and cap refill Respiratory: Normal respiratory effort. No wheezes/rales/rhonchi. Gastrointestinal: Soft and nontender. No distention. Musculoskeletal: Left foot without  obvious deformity, swelling or dislocation. Mild erythema noted dorsally. Patient exquisitely tender to light touch over the dorsum of the foot and ankle on the left. No calf or achilles tenderness appreciated.  Nontender with normal range of motion in all  Other extremities.  Neurologic:  Normal speech and language. No gross focal neurologic deficits are appreciated. Skin:  Skin is warm, dry and intact. No rash noted. ____________________________________________   LABS (pertinent positives/negatives) Labs Reviewed  URIC ACID - Abnormal; Notable for the following:    Uric Acid, Serum 8.2 (*)    All other components within normal limits  ____________________________________________   RADIOLOGY Left Foot IMPRESSION: No acute abnormality noted.  I, Nyema Hachey, Charlesetta Ivory, personally viewed and evaluated these images (plain radiographs) as part of my medical decision making, as well as reviewing the written report by the radiologist. ____________________________________________  PROCEDURES  Norco 5-325 mg PO Post-op shoe ____________________________________________  INITIAL IMPRESSION / ASSESSMENT AND PLAN / ED COURSE  Patient with an initial acute gout attack to the left foot. He will be discharged with instruction on pain management. He will be discharged with a prescription for Voltaren and Norco to dose as directed. Rest with the foot elevated as needed. Follow-up with his primary care provider for ongoing management.  ____________________________________________  FINAL CLINICAL IMPRESSION(S) / ED DIAGNOSES  Final diagnoses:  Acute idiopathic gout of left foot      Lissa Hoard, PA-C 06/17/15 1634  Emily Filbert, MD 06/17/15 [phone]

## 2015-10-28 ENCOUNTER — Other Ambulatory Visit: Payer: Self-pay | Admitting: Family Medicine

## 2016-02-04 ENCOUNTER — Other Ambulatory Visit: Payer: Self-pay | Admitting: Family Medicine

## 2016-03-08 ENCOUNTER — Other Ambulatory Visit: Payer: Self-pay | Admitting: Family Medicine

## 2016-04-01 ENCOUNTER — Other Ambulatory Visit: Payer: Self-pay | Admitting: Family Medicine

## 2016-04-29 ENCOUNTER — Other Ambulatory Visit: Payer: Self-pay | Admitting: Family Medicine

## 2016-07-19 ENCOUNTER — Other Ambulatory Visit: Payer: Self-pay | Admitting: Family Medicine

## 2016-07-20 ENCOUNTER — Ambulatory Visit (INDEPENDENT_AMBULATORY_CARE_PROVIDER_SITE_OTHER): Payer: BLUE CROSS/BLUE SHIELD | Admitting: Family Medicine

## 2016-07-20 ENCOUNTER — Encounter: Payer: Self-pay | Admitting: Family Medicine

## 2016-07-20 VITALS — BP 118/70 | HR 74 | Temp 98.0°F | Resp 18 | Ht 76.0 in | Wt 277.0 lb

## 2016-07-20 DIAGNOSIS — K760 Fatty (change of) liver, not elsewhere classified: Secondary | ICD-10-CM | POA: Diagnosis not present

## 2016-07-20 DIAGNOSIS — E7800 Pure hypercholesterolemia, unspecified: Secondary | ICD-10-CM | POA: Diagnosis not present

## 2016-07-20 MED ORDER — COLCHICINE 0.6 MG PO TABS
ORAL_TABLET | ORAL | 0 refills | Status: DC
Start: 2016-07-20 — End: 2017-09-08

## 2016-07-20 MED ORDER — PHENTERMINE HCL 37.5 MG PO CAPS: 37.5000 mg | ORAL_CAPSULE | ORAL | 2 refills | Status: DC

## 2016-07-20 NOTE — Progress Notes (Signed)
Subjective:    Patient ID: Sergio Gonzalez, male    DOB: 08-08-1975, 41 y.o.   MRN: [id number]  HPI  Patient has a history of HLD and is currently on pravastatin 20 mg poqday.  He also has a history of fatty liver disease and we have been encouraging him to avoid alcohol and recommending diet/exercise/weight loss.  He is here today for follow up. He is not exercising. He is not following any specific diet. 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 His weight is actually up from last year when it was 263. He denies any chest pain shortness of breath or dyspnea on exertion. He is interested in taking phentermine again to help lose weight. Past Medical History:  Diagnosis Date  . Avascular necrosis of femur head, right (HCC)   . GERD (gastroesophageal reflux disease)   . H/O hiatal hernia   . Hyperlipidemia   . Smoker    Past Surgical History:  Procedure Laterality Date  . ANTERIOR CERVICAL DECOMP/DISCECTOMY FUSION  08/20/2011   Procedure: ANTERIOR CERVICAL DECOMPRESSION/DISCECTOMY FUSION 2 LEVELS;  Surgeon: [name], MD;  Location: [facility];  Service: Neurosurgery;  Laterality: Bilateral;  Cervical five-six, six-seven anterior cervical discectomy with fusion  . EYE SURGERY    . lasic    . SPINE SURGERY    . TONSILLECTOMY     Current Outpatient Prescriptions on File Prior to Visit  Medication Sig Dispense Refill  . diclofenac (VOLTAREN) 75 MG EC tablet Take 1 tablet (75 mg total) by mouth 2 (two) times daily. 20 tablet 0  . omeprazole (PRILOSEC) 40 MG capsule TAKE 1 CAPSULE BY MOUTH DAILY 30 capsule 0  . pravastatin (PRAVACHOL) 20 MG tablet TAKE 1 TABLET(20 MG) BY MOUTH DAILY 30 tablet 0   No current facility-administered medications on file prior to visit.    No Known Allergies Social History   Social History  . Marital status: Married    Spouse name: N/A  . Number of children: N/A  . Years of education: 33yr  [employer]   Occupational History  . farm work     Social History Main Topics  . Smoking status: Current Some Day Smoker    Packs/day: 0.25    Types: Cigarettes  . Smokeless tobacco: Never Used  . Alcohol use 7.2 oz/week    12 Cans of beer per week  . Drug use: No  . Sexual activity: Yes   Other Topics Concern  . Not on file   Social History Narrative  . No narrative on file      Review of Systems  All other systems reviewed and are negative.      Objective:   Physical Exam  Constitutional: He appears well-developed and well-nourished.  Cardiovascular: Normal rate, regular rhythm and normal heart sounds.   Pulmonary/Chest: Effort normal and breath sounds normal. No respiratory distress. He has no wheezes. He has no rales.  Abdominal: Soft. Bowel sounds are normal. He exhibits no distension. There is no tenderness. There is no rebound.  Musculoskeletal: He exhibits no edema.  Vitals reviewed.         Assessment & Plan:  Pure hypercholesterolemia  Fatty liver disease, nonalcoholic Just like last year, I recommended against the use of phentermine. I explained that that is not a permanent solution and the patients typically gain weight back if not more. This is in fact what happened to him.  However he would really like to take something to try to help jump start his weight loss. We also discussed less than 1500 cal a day diet, 30 minutes a day of aerobic exercise, restricting carbohydrates. I also would like the patient to return fasting to check a CMP, fasting lipid panel. Recommended avoidance of alcohol, soda, and sugary drinks.

## 2016-08-18 ENCOUNTER — Other Ambulatory Visit: Payer: Self-pay | Admitting: Family Medicine

## 2016-09-24 ENCOUNTER — Other Ambulatory Visit: Payer: BLUE CROSS/BLUE SHIELD

## 2016-09-25 LAB — COMPLETE METABOLIC PANEL WITH GFR
ALK PHOS: 100 U/L (ref 40–115)
ALT: 59 U/L — ABNORMAL HIGH (ref 9–46)
AST: 32 U/L (ref 10–40)
Albumin: 4.9 g/dL (ref 3.6–5.1)
BUN: 16 mg/dL (ref 7–25)
CO2: 23 mmol/L (ref 20–32)
Calcium: 9.8 mg/dL (ref 8.6–10.3)
Chloride: 102 mmol/L (ref 98–110)
Creat: 1.13 mg/dL (ref 0.60–1.35)
GFR, EST NON AFRICAN AMERICAN: 81 mL/min (ref >=60)
GFR, Est African American: >89 mL/min (ref >=60)
Glucose, Bld: 90 mg/dL (ref 70–99)
POTASSIUM: 4.6 mmol/L (ref 3.5–5.3)
SODIUM: 139 mmol/L (ref 135–146)
Total Bilirubin: 1.2 mg/dL (ref 0.2–1.2)
Total Protein: 7.2 g/dL (ref 6.1–8.1)

## 2016-09-25 LAB — LIPID PANEL
CHOL/HDL RATIO: 5 ratio — AB (ref <5.0)
Cholesterol: 194 mg/dL (ref <200)
HDL: 39 mg/dL — AB (ref >40)
LDL Cholesterol: 126 mg/dL — ABNORMAL HIGH (ref <100)
Triglycerides: 145 mg/dL (ref <150)
VLDL: 29 mg/dL (ref <30)

## 2016-09-28 ENCOUNTER — Encounter: Payer: Self-pay | Admitting: Family Medicine

## 2016-10-11 ENCOUNTER — Other Ambulatory Visit: Payer: Self-pay | Admitting: Family Medicine

## 2016-11-04 ENCOUNTER — Other Ambulatory Visit: Payer: Self-pay | Admitting: Family Medicine

## 2016-11-04 NOTE — Telephone Encounter (Signed)
Medication refilled per protocol. 

## 2017-05-13 ENCOUNTER — Other Ambulatory Visit: Payer: Self-pay | Admitting: Family Medicine

## 2017-08-07 ENCOUNTER — Other Ambulatory Visit: Payer: Self-pay | Admitting: Family Medicine

## 2017-08-11 ENCOUNTER — Ambulatory Visit (INDEPENDENT_AMBULATORY_CARE_PROVIDER_SITE_OTHER): Payer: 59 | Admitting: Family Medicine

## 2017-08-11 ENCOUNTER — Encounter: Payer: Self-pay | Admitting: Family Medicine

## 2017-08-11 VITALS — BP 124/78 | HR 70 | Temp 98.3°F | Resp 16 | Ht 76.0 in | Wt 271.0 lb

## 2017-08-11 DIAGNOSIS — E7800 Pure hypercholesterolemia, unspecified: Secondary | ICD-10-CM

## 2017-08-11 DIAGNOSIS — K760 Fatty (change of) liver, not elsewhere classified: Secondary | ICD-10-CM

## 2017-08-11 DIAGNOSIS — Z8739 Personal history of other diseases of the musculoskeletal system and connective tissue: Secondary | ICD-10-CM

## 2017-08-11 MED ORDER — PHENTERMINE HCL 37.5 MG PO TABS: 37.5000 mg | ORAL_TABLET | Freq: Every day | ORAL | 2 refills | Status: DC

## 2017-08-11 NOTE — Progress Notes (Signed)
Subjective:    Patient ID: Sergio Gonzalez, male    DOB: [birth year], 42 y.o.   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  He has a past medical history of fatty liver disease, gout, hyperlipidemia.  He is due today to recheck his lab work.  He denies any chest pain shortness of breath or dyspnea on exertion.  He denies any angina.  He denies any abdominal pain, nausea vomiting melena or hematochezia.  He has almost completely stop drinking alcohol.  He has an occasional beer.  Unfortunately he is not getting any regular aerobic exercise.  His weight is down 6 pounds from last year but he admits that he has not made any significant lifestyle changes.  He continues to eat a diet full of fast food, saturated fat, etc. he is currently using colchicine on an as-needed basis for gout.  He states that he has very infrequent gout exacerbations and declines taking a medicine every day to prevent gout Past Medical History:  Diagnosis Date  . Avascular necrosis of femur head, right (HCC)   . GERD (gastroesophageal reflux disease)   . H/O hiatal hernia   . Hyperlipidemia   . Smoker    Past Surgical History:  Procedure Laterality Date  . ANTERIOR CERVICAL DECOMP/DISCECTOMY FUSION  08/20/2011   Procedure: ANTERIOR CERVICAL DECOMPRESSION/DISCECTOMY FUSION 2 LEVELS;  Surgeon: [name], MD;  Location: [facility];  Service: Neurosurgery;  Laterality: Bilateral;  Cervical five-six, six-seven anterior cervical discectomy with fusion  . EYE SURGERY    . lasic    . SPINE SURGERY    . TONSILLECTOMY     Current Outpatient Medications on File Prior to Visit  Medication Sig Dispense Refill  . colchicine 0.6 MG tablet Take 2 tabs at the first sigh of gout and then 1 tab in 1 hour if pain persists 30 tablet 0  . omeprazole (PRILOSEC) 40 MG capsule TAKE 1 CAPSULE BY MOUTH DAILY 90 capsule 0  . pravastatin (PRAVACHOL) 20 MG tablet Take 1  tablet (20 mg total) by mouth daily at 6 PM. 90 tablet 1   No current facility-administered medications on file prior to visit.    No Known Allergies Social History   Socioeconomic History  . Marital status: Married    Spouse name: Not on file  . Number of children: Not on file  . Years of education: 33yr [employer]  . Highest education level: Not on file  Occupational History  . Occupation: farm work   Engineer, production  . Financial resource strain: Not on file  . Food insecurity:    Worry: Not on file    Inability: Not on file  . Transportation needs:    Medical: Not on file    Non-medical: Not on file  Tobacco Use  . Smoking status: Current Some Day Smoker    Packs/day: 0.25    Types: Cigarettes  . Smokeless tobacco: Never Used  Substance and Sexual Activity  . Alcohol use: Yes    Alcohol/week: 7.2 oz    Types: 12 Cans of beer per week  . Drug use: No  . Sexual activity: Yes  Lifestyle  . Physical activity:    Days per week: Not on file    Minutes per session: Not on file  . Stress: Not on file  Relationships  . Social connections:    Talks on phone: Not on file  Gets together: Not on file    Attends religious service: Not on file    Active member of club or organization: Not on file    Attends meetings of clubs or organizations: Not on file    Relationship status: Not on file  . Intimate partner violence:    Fear of current or ex partner: Not on file    Emotionally abused: Not on file    Physically abused: Not on file    Forced sexual activity: Not on file  Other Topics Concern  . Not on file  Social History Narrative  . Not on file      Review of Systems  All other systems reviewed and are negative.      Objective:   Physical Exam  Constitutional: He appears well-developed and well-nourished.  Cardiovascular: Normal rate, regular rhythm and normal heart sounds.  Pulmonary/Chest: Effort normal and breath sounds normal. No respiratory distress. He has no  wheezes. He has no rales.  Abdominal: Soft. Bowel sounds are normal. He exhibits no distension. There is no tenderness. There is no rebound.  Musculoskeletal: He exhibits no edema.  Vitals reviewed.         Assessment & Plan:  Fatty liver disease, nonalcoholic - Plan: CBC with Differential/Platelet, COMPLETE METABOLIC PANEL WITH GFR, Lipid panel  History of gout - Plan: Uric acid  Pure hypercholesterolemia Patient would like to try phentermine 1 additional time to help with weight loss.  Begin 37.5 mg p.o. every morning for 3 months.  I cautioned the patient about possible cardiovascular effects.  I recommended that he drink plenty of water and monitor closely for signs of dehydration, heat exhaustion on the medication particular given his job.  I recommended 30 minutes a day of aerobic exercise.  I recommended changing his diet to a more vegetarian diet and avoiding fast food and saturated fat.  I would like to see the patient lose approximately 30 pounds to help address his obesity and fatty liver disease.  Recheck a CMP, CBC, and fasting lipid panel.  Patient elects against using allopurinol on a daily basis to prevent gout

## 2017-08-12 LAB — COMPLETE METABOLIC PANEL WITH GFR
AG Ratio: 2.2 (calc) (ref 1.0–2.5)
ALBUMIN MSPROF: 4.8 g/dL (ref 3.6–5.1)
ALKALINE PHOSPHATASE (APISO): 108 U/L (ref 40–115)
ALT: 52 U/L — AB (ref 9–46)
AST: 30 U/L (ref 10–40)
BILIRUBIN TOTAL: 0.9 mg/dL (ref 0.2–1.2)
BUN: 13 mg/dL (ref 7–25)
CHLORIDE: 102 mmol/L (ref 98–110)
CO2: 27 mmol/L (ref 20–32)
CREATININE: 1.06 mg/dL (ref 0.60–1.35)
Calcium: 9.6 mg/dL (ref 8.6–10.3)
GFR, EST AFRICAN AMERICAN: 101 mL/min/{1.73_m2} (ref >=60)
GFR, Est Non African American: 87 mL/min/{1.73_m2} (ref >=60)
Globulin: 2.2 g/dL (calc) (ref 1.9–3.7)
Glucose, Bld: 108 mg/dL — ABNORMAL HIGH (ref 65–99)
Potassium: 4.6 mmol/L (ref 3.5–5.3)
Sodium: 140 mmol/L (ref 135–146)
TOTAL PROTEIN: 7 g/dL (ref 6.1–8.1)

## 2017-08-12 LAB — CBC WITH DIFFERENTIAL/PLATELET
BASOS ABS: 42 {cells}/uL (ref 0–200)
Basophils Relative: 0.7 %
EOS PCT: 1.5 %
Eosinophils Absolute: 90 cells/uL (ref 15–500)
HCT: 49.9 % (ref 38.5–50.0)
Hemoglobin: 16.8 g/dL (ref 13.2–17.1)
Lymphs Abs: 1974 cells/uL (ref 850–3900)
MCH: 30.5 pg (ref 27.0–33.0)
MCHC: 33.7 g/dL (ref 32.0–36.0)
MCV: 90.7 fL (ref 80.0–100.0)
MONOS PCT: 7.1 %
MPV: 12 fL (ref 7.5–12.5)
Neutro Abs: 3468 cells/uL (ref 1500–7800)
Neutrophils Relative %: 57.8 %
Platelets: 145 10*3/uL (ref 140–400)
RBC: 5.5 10*6/uL (ref 4.20–5.80)
RDW: 12.5 % (ref 11.0–15.0)
TOTAL LYMPHOCYTE: 32.9 %
WBC mixed population: 426 cells/uL (ref 200–950)
WBC: 6 10*3/uL (ref 3.8–10.8)

## 2017-08-12 LAB — LIPID PANEL
CHOL/HDL RATIO: 3.9 (calc) (ref <5.0)
CHOLESTEROL: 186 mg/dL (ref <200)
HDL: 48 mg/dL (ref >40)
LDL Cholesterol (Calc): 99 mg/dL (calc)
Non-HDL Cholesterol (Calc): 138 mg/dL (calc) — ABNORMAL HIGH (ref <130)
TRIGLYCERIDES: 271 mg/dL — AB (ref <150)

## 2017-08-12 LAB — URIC ACID: Uric Acid, Serum: 9.2 mg/dL — ABNORMAL HIGH (ref 4.0–8.0)

## 2017-08-17 ENCOUNTER — Encounter: Payer: Self-pay | Admitting: Family Medicine

## 2017-08-22 ENCOUNTER — Other Ambulatory Visit: Payer: Self-pay | Admitting: Family Medicine

## 2017-09-08 ENCOUNTER — Other Ambulatory Visit: Payer: Self-pay | Admitting: Family Medicine

## 2017-09-09 ENCOUNTER — Other Ambulatory Visit: Payer: Self-pay | Admitting: Family Medicine

## 2017-09-09 MED ORDER — COLCHICINE 0.6 MG PO CAPS: ORAL_CAPSULE | ORAL | 3 refills | Status: DC

## 2017-09-13 ENCOUNTER — Other Ambulatory Visit: Payer: Self-pay | Admitting: *Deleted

## 2017-09-13 MED ORDER — MITIGARE 0.6 MG PO CAPS: ORAL_CAPSULE | ORAL | 3 refills | Status: DC

## 2017-11-06 ENCOUNTER — Other Ambulatory Visit: Payer: Self-pay | Admitting: Family Medicine

## 2017-11-15 ENCOUNTER — Other Ambulatory Visit: Payer: Self-pay | Admitting: Family Medicine

## 2018-02-24 ENCOUNTER — Other Ambulatory Visit: Payer: Self-pay | Admitting: Family Medicine

## 2018-05-18 ENCOUNTER — Other Ambulatory Visit: Payer: Self-pay | Admitting: Family Medicine

## 2018-08-17 ENCOUNTER — Other Ambulatory Visit: Payer: Self-pay | Admitting: Family Medicine

## 2018-09-09 ENCOUNTER — Other Ambulatory Visit: Payer: Self-pay | Admitting: Family Medicine

## 2018-09-15 ENCOUNTER — Other Ambulatory Visit: Payer: Self-pay | Admitting: Family Medicine

## 2018-09-18 ENCOUNTER — Other Ambulatory Visit: Payer: Self-pay | Admitting: Family Medicine

## 2018-09-18 MED ORDER — OMEPRAZOLE 40 MG PO CPDR: 40.0000 mg | DELAYED_RELEASE_CAPSULE | Freq: Every day | ORAL | 3 refills | Status: DC

## 2018-09-20 ENCOUNTER — Other Ambulatory Visit: Payer: Self-pay

## 2018-09-21 ENCOUNTER — Encounter (INDEPENDENT_AMBULATORY_CARE_PROVIDER_SITE_OTHER): Payer: Self-pay

## 2018-09-21 ENCOUNTER — Encounter: Payer: Self-pay | Admitting: Family Medicine

## 2018-09-21 ENCOUNTER — Ambulatory Visit (INDEPENDENT_AMBULATORY_CARE_PROVIDER_SITE_OTHER): Payer: No Typology Code available for payment source | Admitting: Family Medicine

## 2018-09-21 VITALS — BP 110/86 | HR 78 | Temp 98.9°F | Resp 16 | Ht 76.0 in | Wt 278.0 lb

## 2018-09-21 DIAGNOSIS — K760 Fatty (change of) liver, not elsewhere classified: Secondary | ICD-10-CM | POA: Diagnosis not present

## 2018-09-21 DIAGNOSIS — Z8739 Personal history of other diseases of the musculoskeletal system and connective tissue: Secondary | ICD-10-CM

## 2018-09-21 DIAGNOSIS — Z23 Encounter for immunization: Secondary | ICD-10-CM | POA: Diagnosis not present

## 2018-09-21 DIAGNOSIS — F17200 Nicotine dependence, unspecified, uncomplicated: Secondary | ICD-10-CM

## 2018-09-21 DIAGNOSIS — E7800 Pure hypercholesterolemia, unspecified: Secondary | ICD-10-CM

## 2018-09-21 NOTE — Addendum Note (Signed)
Addended by: Shary Decamp B on: 09/21/2018 09:46 AM   Modules accepted: Orders

## 2018-09-21 NOTE — Progress Notes (Signed)
Subjective:    Patient ID: Sergio Gonzalez, male    DOB: 01/12/1976, 43 y.o.   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  He has a past medical history of fatty liver disease, gout, hyperlipidemia.  He is due today to recheck his lab work.  He denies any chest pain shortness of breath or dyspnea on exertion.  He denies any angina.  He denies any abdominal pain, nausea vomiting melena or hematochezia.  Patient recently had a gout attack about 2-1/2 weeks ago.,  The patient is using colchicine on an as-needed basis.  However he gets 1-2 gout attacks a year.  Is usually associated with consuming pork.  He also continues to drink.  He also continues to smoke. Past Medical History:  Diagnosis Date  . Avascular necrosis of femur head, right (HCC)   . GERD (gastroesophageal reflux disease)   . H/O hiatal hernia   . Hyperlipidemia   . Smoker    Past Surgical History:  Procedure Laterality Date  . ANTERIOR CERVICAL DECOMP/DISCECTOMY FUSION  08/20/2011   Procedure: ANTERIOR CERVICAL DECOMPRESSION/DISCECTOMY FUSION 2 LEVELS;  Surgeon: Reinaldo Meekerandy O Kritzer, MD;  Location: [facility];  Service: Neurosurgery;  Laterality: Bilateral;  Cervical five-six, six-seven anterior cervical discectomy with fusion  . EYE SURGERY    . lasic    . SPINE SURGERY    . TONSILLECTOMY     Current Outpatient Medications on File Prior to Visit  Medication Sig Dispense Refill  . MITIGARE 0.6 MG CAPS TAKE 2 TABLETS BY MOUTH AT FIRST SIGN OF GOUT, THEN TAKE 1 TABLET BY MOUTH IN 1 HOUR IF PAIN CONTINUES 30 capsule 3  . omeprazole (PRILOSEC) 40 MG capsule Take 1 capsule (40 mg total) by mouth daily. 90 capsule 3  . pravastatin (PRAVACHOL) 20 MG tablet TAKE 1 TABLET(20 MG) BY MOUTH DAILY AT 6 PM 90 tablet 0   No current facility-administered medications on file prior to visit.    No Known Allergies Social History   Socioeconomic History  . Marital status:  Married    Spouse name: Not on file  . Number of children: Not on file  . Years of education: 6432yr [employer]  . Highest education level: Not on file  Occupational History  . Occupation: farm work   Engineer, productionocial Needs  . Financial resource strain: Not on file  . Food insecurity    Worry: Not on file    Inability: Not on file  . Transportation needs    Medical: Not on file    Non-medical: Not on file  Tobacco Use  . Smoking status: Current Some Day Smoker    Packs/day: 0.25    Types: Cigarettes  . Smokeless tobacco: Never Used  Substance and Sexual Activity  . Alcohol use: Yes    Alcohol/week: 12.0 standard drinks    Types: 12 Cans of beer per week  . Drug use: No  . Sexual activity: Yes  Lifestyle  . Physical activity    Days per week: Not on file    Minutes per session: Not on file  . Stress: Not on file  Relationships  . Social Musicianconnections    Talks on phone: Not on file    Gets together: Not on file    Attends religious service: Not on file    Active member of club or organization: Not on file    Attends meetings of clubs or organizations:  Not on file    Relationship status: Not on file  . Intimate partner violence    Fear of current or ex partner: Not on file    Emotionally abused: Not on file    Physically abused: Not on file    Forced sexual activity: Not on file  Other Topics Concern  . Not on file  Social History Narrative  . Not on file      Review of Systems  All other systems reviewed and are negative.      Objective:   Physical Exam  Constitutional: He appears well-developed and well-nourished.  Cardiovascular: Normal rate, regular rhythm and normal heart sounds.  Pulmonary/Chest: Effort normal and breath sounds normal. No respiratory distress. He has no wheezes. He has no rales.  Abdominal: Soft. Bowel sounds are normal. He exhibits no distension. There is no abdominal tenderness. There is no rebound.  Musculoskeletal:        General: No edema.  Vitals  reviewed.         Assessment & Plan:  The primary encounter diagnosis was Fatty liver disease, nonalcoholic. Diagnoses of History of gout, Pure hypercholesterolemia, and Smoker were also pertinent to this visit.  I encouraged abstinence from alcohol and tobacco.  I recommended a flu shot which the patient received today.  His blood pressure looks good.  I will check a uric acid level and if significantly greater than 6 I would recommend starting allopurinol.  Recommended weight loss diet and exercise.  Check a CMP and a fasting lipid panel.  Ideally I like to see his LDL cholesterol below 100 and his triglycerides below 150

## 2018-09-22 LAB — CBC WITH DIFFERENTIAL/PLATELET
Absolute Monocytes: 448 cells/uL (ref 200–950)
Basophils Absolute: 49 cells/uL (ref 0–200)
Basophils Relative: 0.7 %
Eosinophils Absolute: 203 cells/uL (ref 15–500)
Eosinophils Relative: 2.9 %
HCT: 48.3 % (ref 38.5–50.0)
Hemoglobin: 16.2 g/dL (ref 13.2–17.1)
Lymphs Abs: 2240 cells/uL (ref 850–3900)
MCH: 30.6 pg (ref 27.0–33.0)
MCHC: 33.5 g/dL (ref 32.0–36.0)
MCV: 91.1 fL (ref 80.0–100.0)
MPV: 12.1 fL (ref 7.5–12.5)
Monocytes Relative: 6.4 %
Neutro Abs: 4060 cells/uL (ref 1500–7800)
Neutrophils Relative %: 58 %
Platelets: 145 10*3/uL (ref 140–400)
RBC: 5.3 10*6/uL (ref 4.20–5.80)
RDW: 12.8 % (ref 11.0–15.0)
Total Lymphocyte: 32 %
WBC: 7 10*3/uL (ref 3.8–10.8)

## 2018-09-22 LAB — LIPID PANEL
Cholesterol: 197 mg/dL (ref <200)
HDL: 42 mg/dL (ref >=40)
LDL Cholesterol (Calc): 115 mg/dL (calc) — ABNORMAL HIGH
Non-HDL Cholesterol (Calc): 155 mg/dL (calc) — ABNORMAL HIGH (ref <130)
Total CHOL/HDL Ratio: 4.7 (calc) (ref <5.0)
Triglycerides: 281 mg/dL — ABNORMAL HIGH (ref <150)

## 2018-09-22 LAB — COMPLETE METABOLIC PANEL WITH GFR
AG Ratio: 2 (calc) (ref 1.0–2.5)
ALT: 75 U/L — ABNORMAL HIGH (ref 9–46)
AST: 39 U/L (ref 10–40)
Albumin: 4.7 g/dL (ref 3.6–5.1)
Alkaline phosphatase (APISO): 99 U/L (ref 36–130)
BUN: 13 mg/dL (ref 7–25)
CO2: 25 mmol/L (ref 20–32)
Calcium: 9.6 mg/dL (ref 8.6–10.3)
Chloride: 102 mmol/L (ref 98–110)
Creat: 1.06 mg/dL (ref 0.60–1.35)
GFR, Est African American: 100 mL/min/{1.73_m2} (ref >=60)
GFR, Est Non African American: 86 mL/min/{1.73_m2} (ref >=60)
Globulin: 2.4 g/dL (calc) (ref 1.9–3.7)
Glucose, Bld: 106 mg/dL — ABNORMAL HIGH (ref 65–99)
Potassium: 4.5 mmol/L (ref 3.5–5.3)
Sodium: 140 mmol/L (ref 135–146)
Total Bilirubin: 0.9 mg/dL (ref 0.2–1.2)
Total Protein: 7.1 g/dL (ref 6.1–8.1)

## 2018-09-22 LAB — URIC ACID: Uric Acid, Serum: 9.9 mg/dL — ABNORMAL HIGH (ref 4.0–8.0)

## 2018-09-27 ENCOUNTER — Other Ambulatory Visit: Payer: Self-pay | Admitting: Family Medicine

## 2018-09-27 DIAGNOSIS — M1A9XX0 Chronic gout, unspecified, without tophus (tophi): Secondary | ICD-10-CM

## 2018-09-27 MED ORDER — ALLOPURINOL 300 MG PO TABS: 300.0000 mg | ORAL_TABLET | Freq: Every day | ORAL | 3 refills | Status: DC

## 2018-09-27 MED ORDER — MITIGARE 0.6 MG PO CAPS: 0.6000 mg | ORAL_CAPSULE | Freq: Every day | ORAL | 3 refills | Status: DC

## 2018-10-18 ENCOUNTER — Other Ambulatory Visit: Payer: Self-pay | Admitting: Family Medicine

## 2018-11-16 ENCOUNTER — Other Ambulatory Visit: Payer: Self-pay | Admitting: Family Medicine

## 2018-11-22 ENCOUNTER — Other Ambulatory Visit: Payer: Self-pay | Admitting: Family Medicine

## 2019-01-04 ENCOUNTER — Other Ambulatory Visit: Payer: Self-pay | Admitting: *Deleted

## 2019-01-04 MED ORDER — COLCHICINE 0.6 MG PO CAPS: ORAL_CAPSULE | ORAL | 0 refills | Status: DC

## 2019-02-07 ENCOUNTER — Other Ambulatory Visit: Payer: Self-pay | Admitting: Family Medicine

## 2019-02-17 ENCOUNTER — Other Ambulatory Visit: Payer: Self-pay | Admitting: Family Medicine

## 2019-03-14 ENCOUNTER — Other Ambulatory Visit: Payer: Self-pay | Admitting: *Deleted

## 2019-03-14 MED ORDER — COLCHICINE 0.6 MG PO CAPS: ORAL_CAPSULE | ORAL | 3 refills | Status: DC

## 2019-05-18 ENCOUNTER — Other Ambulatory Visit: Payer: Self-pay | Admitting: Family Medicine

## 2019-05-29 ENCOUNTER — Ambulatory Visit: Payer: No Typology Code available for payment source

## 2019-05-29 ENCOUNTER — Other Ambulatory Visit: Payer: Self-pay

## 2019-05-29 ENCOUNTER — Ambulatory Visit (INDEPENDENT_AMBULATORY_CARE_PROVIDER_SITE_OTHER): Payer: No Typology Code available for payment source | Admitting: Orthopaedic Surgery

## 2019-05-29 ENCOUNTER — Encounter: Payer: Self-pay | Admitting: Orthopaedic Surgery

## 2019-05-29 ENCOUNTER — Other Ambulatory Visit: Payer: Self-pay | Admitting: Orthopaedic Surgery

## 2019-05-29 VITALS — BP 138/91 | HR 79 | Ht 76.0 in | Wt 287.0 lb

## 2019-05-29 DIAGNOSIS — M25522 Pain in left elbow: Secondary | ICD-10-CM

## 2019-05-29 DIAGNOSIS — M10022 Idiopathic gout, left elbow: Secondary | ICD-10-CM | POA: Diagnosis not present

## 2019-05-29 MED ORDER — PREDNISONE 5 MG (21) PO TBPK
ORAL_TABLET | ORAL | 0 refills | Status: DC
Start: 2019-05-29 — End: 2019-10-01

## 2019-05-29 NOTE — Progress Notes (Signed)
Subjective:    Patient ID: Sergio Gonzalez, male    DOB: [birth year], 44 y.o.   MRN: [id number]  HPI He has developed left elbow pain.  He has pain in trying to flex the elbow.  He has swelling of his hand.  He has no trauma, no unusual event.  He hit his elbow about two weeks ago but he did OK until the last few days.  He has no numbness. He has no redness.  He has gout and is taking allopurinol and occasionally colchicine.  He is a self Clinical biochemist.  Review of Systems  Constitutional: Positive for activity change.  Musculoskeletal: Positive for arthralgias and myalgias.  All other systems reviewed and are negative.  For Review of Systems, all other systems reviewed and are negative.  The following is a summary of the past history medically, past history surgically, known current medicines, social history and family history.  This information is gathered electronically by the computer from prior information and documentation.  I review this each visit and have found including this information at this point in the chart is beneficial and informative.   Past Medical History:  Diagnosis Date  . Avascular necrosis of femur head, right (HCC)   . GERD (gastroesophageal reflux disease)   . H/O hiatal hernia   . Hyperlipidemia   . Smoker     Past Surgical History:  Procedure Laterality Date  . ANTERIOR CERVICAL DECOMP/DISCECTOMY FUSION  08/20/2011   Procedure: ANTERIOR CERVICAL DECOMPRESSION/DISCECTOMY FUSION 2 LEVELS;  Surgeon: [name], MD;  Location: [facility];  Service: Neurosurgery;  Laterality: Bilateral;  Cervical five-six, six-seven anterior cervical discectomy with fusion  . EYE SURGERY    . lasic    . SPINE SURGERY    . TONSILLECTOMY      Current Outpatient Medications on File Prior to Visit  Medication Sig Dispense Refill  . allopurinol (ZYLOPRIM) 300 MG tablet Take 1 tablet (300 mg total) by mouth daily. 90 tablet 3  . Colchicine 0.6 MG CAPS TAKE 2 CAPSULES  BY MOUTH AT FIRST SIGN OF GOUT THEN 1 CAPSULE BY MOUTH IN 1 HOUR IF PAIN CONTINUES 30 capsule 3  . omeprazole (PRILOSEC) 40 MG capsule Take 1 capsule (40 mg total) by mouth daily. 90 capsule 3  . pravastatin (PRAVACHOL) 20 MG tablet TAKE 1 TABLET(20 MG) BY MOUTH DAILY AT 6 PM 90 tablet 0   No current facility-administered medications on file prior to visit.    Social History   Socioeconomic History  . Marital status: Married    Spouse name: Not on file  . Number of children: Not on file  . Years of education: 103yr [employer]  . Highest education level: Not on file  Occupational History  . Occupation: farm work   Tobacco Use  . Smoking status: Current Some Day Smoker    Packs/day: 0.25    Types: Cigarettes  . Smokeless tobacco: Never Used  Substance and Sexual Activity  . Alcohol use: Yes    Alcohol/week: 12.0 standard drinks    Types: 12 Cans of beer per week  . Drug use: No  . Sexual activity: Yes  Other Topics Concern  . Not on file  Social History Narrative  . Not on file   Social Determinants of Health   Financial Resource Strain:   . Difficulty of Paying Living Expenses:   Food Insecurity:   . Worried About Programme researcher, broadcasting/film/video in the Last Year:   .  Ran Out of Food in the Last Year:   Transportation Needs:   . Film/video editor (Medical):   Marland Kitchen Lack of Transportation (Non-Medical):   Physical Activity:   . Days of Exercise per Week:   . Minutes of Exercise per Session:   Stress:   . Feeling of Stress :   Social Connections:   . Frequency of Communication with Friends and Family:   . Frequency of Social Gatherings with Friends and Family:   . Attends Religious Services:   . Active Member of Clubs or Organizations:   . Attends Archivist Meetings:   Marland Kitchen Marital Status:   Intimate Partner Violence:   . Fear of Current or Ex-Partner:   . Emotionally Abused:   Marland Kitchen Physically Abused:   . Sexually Abused:     Family History  Problem Relation Age of Onset    . Cancer Unknown        family history   . Diabetes Unknown        family history   . Cancer Mother   . Cancer Maternal Grandfather   . Cancer Paternal Grandfather     BP (!) 138/91   Pulse 79   Ht 6\' 4"  (1.93 m)   Wt 287 lb (130.2 kg)   BMI 34.93 kg/m   Body mass index is 34.93 kg/m.     Objective:   Physical Exam Vitals and nursing note reviewed.  Constitutional:      Appearance: He is well-developed.  HENT:     Head: Normocephalic and atraumatic.  Eyes:     Conjunctiva/sclera: Conjunctivae normal.     Pupils: Pupils are equal, round, and reactive to light.  Cardiovascular:     Rate and Rhythm: Normal rate and regular rhythm.  Pulmonary:     Effort: Pulmonary effort is normal.  Abdominal:     Palpations: Abdomen is soft.  Musculoskeletal:       Arms:     Cervical back: Normal range of motion and neck supple.  Skin:    General: Skin is warm and dry.  Neurological:     Mental Status: He is alert and oriented to person, place, and time.     Cranial Nerves: No cranial nerve deficit.     Motor: No abnormal muscle tone.     Coordination: Coordination normal.     Deep Tendon Reflexes: Reflexes are normal and symmetric. Reflexes normal.  Psychiatric:        Behavior: Behavior normal.        Thought Content: Thought content normal.        Judgment: Judgment normal.    X-rays were done of the left elbow, reported separately.       Assessment & Plan:   Encounter Diagnoses  Name Primary?  . Pain in left elbow Yes  . Acute idiopathic gout of left elbow    I will give prednisone dose pack.  Increase his allopurinol to 300 twice a day.  Begin the colchicine for three days, three times a day.  I fitted him with sling.  Return in one week.  Call if any problem.  Precautions discussed.   He may need to be switched to [place].  Electronically Signed Sanjuana Kava, MD 4/27/20219:54 AM

## 2019-06-05 ENCOUNTER — Ambulatory Visit: Payer: No Typology Code available for payment source | Admitting: Orthopaedic Surgery

## 2019-08-22 ENCOUNTER — Other Ambulatory Visit: Payer: Self-pay | Admitting: Family Medicine

## 2019-09-04 ENCOUNTER — Encounter: Payer: No Typology Code available for payment source | Admitting: Family Medicine

## 2019-09-07 ENCOUNTER — Encounter: Payer: No Typology Code available for payment source | Admitting: Family Medicine

## 2019-09-23 ENCOUNTER — Other Ambulatory Visit: Payer: Self-pay | Admitting: Family Medicine

## 2019-10-01 ENCOUNTER — Other Ambulatory Visit: Payer: Self-pay

## 2019-10-01 ENCOUNTER — Other Ambulatory Visit: Payer: Self-pay | Admitting: Family Medicine

## 2019-10-01 ENCOUNTER — Encounter: Payer: Self-pay | Admitting: Family Medicine

## 2019-10-01 ENCOUNTER — Emergency Department (HOSPITAL_COMMUNITY)
Admission: EM | Admit: 2019-10-01 | Discharge: 2019-10-02 | Disposition: A | Discharge status: Home / Self Care | Payer: PRIVATE HEALTH INSURANCE | Attending: Emergency Medicine | Admitting: Emergency Medicine

## 2019-10-01 ENCOUNTER — Ambulatory Visit (INDEPENDENT_AMBULATORY_CARE_PROVIDER_SITE_OTHER): Payer: No Typology Code available for payment source | Admitting: Family Medicine

## 2019-10-01 ENCOUNTER — Telehealth: Payer: Self-pay | Admitting: Family Medicine

## 2019-10-01 ENCOUNTER — Ambulatory Visit (HOSPITAL_COMMUNITY)
Admission: RE | Admit: 2019-10-01 | Discharge: 2019-10-01 | Disposition: A | Discharge status: Home / Self Care | Payer: PRIVATE HEALTH INSURANCE | Source: Ambulatory Visit | Attending: Family Medicine | Admitting: Family Medicine

## 2019-10-01 ENCOUNTER — Emergency Department (HOSPITAL_COMMUNITY): Payer: PRIVATE HEALTH INSURANCE

## 2019-10-01 VITALS — BP 104/70 | HR 73 | Temp 96.4°F | Ht 76.0 in | Wt 280.0 lb

## 2019-10-01 DIAGNOSIS — R0609 Other forms of dyspnea: Secondary | ICD-10-CM

## 2019-10-01 DIAGNOSIS — R0602 Shortness of breath: Secondary | ICD-10-CM | POA: Diagnosis not present

## 2019-10-01 DIAGNOSIS — Z79899 Other long term (current) drug therapy: Secondary | ICD-10-CM | POA: Insufficient documentation

## 2019-10-01 DIAGNOSIS — Z0001 Encounter for general adult medical examination with abnormal findings: Secondary | ICD-10-CM | POA: Diagnosis not present

## 2019-10-01 DIAGNOSIS — U071 COVID-19: Secondary | ICD-10-CM

## 2019-10-01 DIAGNOSIS — F17210 Nicotine dependence, cigarettes, uncomplicated: Secondary | ICD-10-CM | POA: Insufficient documentation

## 2019-10-01 DIAGNOSIS — R0600 Dyspnea, unspecified: Secondary | ICD-10-CM

## 2019-10-01 DIAGNOSIS — F17200 Nicotine dependence, unspecified, uncomplicated: Secondary | ICD-10-CM

## 2019-10-01 DIAGNOSIS — Z8739 Personal history of other diseases of the musculoskeletal system and connective tissue: Secondary | ICD-10-CM

## 2019-10-01 DIAGNOSIS — Z0000 Encounter for general adult medical examination without abnormal findings: Secondary | ICD-10-CM

## 2019-10-01 DIAGNOSIS — K760 Fatty (change of) liver, not elsewhere classified: Secondary | ICD-10-CM

## 2019-10-01 DIAGNOSIS — E7800 Pure hypercholesterolemia, unspecified: Secondary | ICD-10-CM

## 2019-10-01 DIAGNOSIS — R079 Chest pain, unspecified: Secondary | ICD-10-CM | POA: Diagnosis not present

## 2019-10-01 DIAGNOSIS — R071 Chest pain on breathing: Secondary | ICD-10-CM

## 2019-10-01 LAB — CBC
HCT: 45.1 % (ref 39.0–52.0)
Hemoglobin: 15.1 g/dL (ref 13.0–17.0)
MCH: 30.6 pg (ref 26.0–34.0)
MCHC: 33.5 g/dL (ref 30.0–36.0)
MCV: 91.5 fL (ref 80.0–100.0)
Platelets: 177 10*3/uL (ref 150–400)
RBC: 4.93 MIL/uL (ref 4.22–5.81)
RDW: 13.1 % (ref 11.5–15.5)
WBC: 7.8 10*3/uL (ref 4.0–10.5)
nRBC: 0 % (ref 0.0–0.2)

## 2019-10-01 LAB — CBC WITH DIFFERENTIAL/PLATELET
Absolute Monocytes: 622 cells/uL (ref 200–950)
Basophils Absolute: 37 cells/uL (ref 0–200)
Basophils Relative: 0.5 %
Eosinophils Absolute: 170 cells/uL (ref 15–500)
Eosinophils Relative: 2.3 %
HCT: 46.9 % (ref 38.5–50.0)
Hemoglobin: 16 g/dL (ref 13.2–17.1)
Lymphs Abs: 2116 cells/uL (ref 850–3900)
MCH: 30.9 pg (ref 27.0–33.0)
MCHC: 34.1 g/dL (ref 32.0–36.0)
MCV: 90.5 fL (ref 80.0–100.0)
MPV: 12 fL (ref 7.5–12.5)
Monocytes Relative: 8.4 %
Neutro Abs: 4455 cells/uL (ref 1500–7800)
Neutrophils Relative %: 60.2 %
Platelets: 167 10*3/uL (ref 140–400)
RBC: 5.18 10*6/uL (ref 4.20–5.80)
RDW: 12.7 % (ref 11.0–15.0)
Total Lymphocyte: 28.6 %
WBC: 7.4 10*3/uL (ref 3.8–10.8)

## 2019-10-01 LAB — BASIC METABOLIC PANEL
Anion gap: 13 (ref 5–15)
BUN: 13 mg/dL (ref 6–20)
CO2: 20 mmol/L — ABNORMAL LOW (ref 22–32)
Calcium: 9.2 mg/dL (ref 8.9–10.3)
Chloride: 106 mmol/L (ref 98–111)
Creatinine, Ser: 1.02 mg/dL (ref 0.61–1.24)
GFR calc Af Amer: >60 mL/min (ref >60)
GFR calc non Af Amer: >60 mL/min (ref >60)
Glucose, Bld: 106 mg/dL — ABNORMAL HIGH (ref 70–99)
Potassium: 3.7 mmol/L (ref 3.5–5.1)
Sodium: 139 mmol/L (ref 135–145)

## 2019-10-01 LAB — COMPLETE METABOLIC PANEL WITH GFR
AG Ratio: 2 (calc) (ref 1.0–2.5)
ALT: 64 U/L — ABNORMAL HIGH (ref 9–46)
AST: 25 U/L (ref 10–40)
Albumin: 4.7 g/dL (ref 3.6–5.1)
Alkaline phosphatase (APISO): 105 U/L (ref 36–130)
BUN: 12 mg/dL (ref 7–25)
CO2: 25 mmol/L (ref 20–32)
Calcium: 9.5 mg/dL (ref 8.6–10.3)
Chloride: 103 mmol/L (ref 98–110)
Creat: 0.96 mg/dL (ref 0.60–1.35)
GFR, Est African American: 112 mL/min/{1.73_m2} (ref >=60)
GFR, Est Non African American: 96 mL/min/{1.73_m2} (ref >=60)
Globulin: 2.4 g/dL (calc) (ref 1.9–3.7)
Glucose, Bld: 106 mg/dL — ABNORMAL HIGH (ref 65–99)
Potassium: 4.1 mmol/L (ref 3.5–5.3)
Sodium: 138 mmol/L (ref 135–146)
Total Bilirubin: 0.8 mg/dL (ref 0.2–1.2)
Total Protein: 7.1 g/dL (ref 6.1–8.1)

## 2019-10-01 LAB — LIPID PANEL
Cholesterol: 197 mg/dL (ref <200)
HDL: 37 mg/dL — ABNORMAL LOW (ref >=40)
LDL Cholesterol (Calc): 121 mg/dL (calc) — ABNORMAL HIGH
Non-HDL Cholesterol (Calc): 160 mg/dL (calc) — ABNORMAL HIGH (ref <130)
Total CHOL/HDL Ratio: 5.3 (calc) — ABNORMAL HIGH (ref <5.0)
Triglycerides: 240 mg/dL — ABNORMAL HIGH (ref <150)

## 2019-10-01 LAB — TROPONIN I (HIGH SENSITIVITY)
Troponin I (High Sensitivity): <2 ng/L (ref <18)
Troponin I (High Sensitivity): <2 ng/L (ref <18)

## 2019-10-01 LAB — D-DIMER, QUANTITATIVE: D-Dimer, Quant: 0.33 mcg/mL FEU (ref <0.50)

## 2019-10-01 NOTE — Telephone Encounter (Signed)
[id number]#[phone] Lab results,xray

## 2019-10-01 NOTE — ED Triage Notes (Signed)
Pt here for central chest pain with radiation to L arm and shob x 2 days. Dx with covid 21 days ago. Went to [facility] for x ray today but was sent here for possible PE study.

## 2019-10-01 NOTE — Progress Notes (Signed)
Subjective:    Patient ID: Sergio Gonzalez, male    DOB: [birth year], 44 y.o.   MRN: [id number]  Patient is a very pleasant 44 year old Caucasian male who is here today for complete physical exam.  Past medical history is significant for tobacco abuse and hyperlipidemia along with obesity.  3 weeks ago, the patient contracted COVID-19.  Ever since that time he has had profound shortness of breath with minimal activity.  He states that as long as he sitting in a chair he is able to breathe.  However if he has to do any work whatsoever he becomes extremely winded with minimal activity.  He denies any angina.  He does have some mild pleurisy on the right side of his chest.  He denies any cough or hemoptysis or fevers or chills.  Oxygen saturation is 99% on room air today.  Lungs are completely clear to auscultation bilaterally suggesting against pneumonia.  He would be at risk for PE given his recent COVID-19.  That is my biggest concern. Past Medical History:  Diagnosis Date  . Avascular necrosis of femur head, right (HCC)   . GERD (gastroesophageal reflux disease)   . H/O hiatal hernia   . Hyperlipidemia   . Smoker    Past Surgical History:  Procedure Laterality Date  . ANTERIOR CERVICAL DECOMP/DISCECTOMY FUSION  08/20/2011   Procedure: ANTERIOR CERVICAL DECOMPRESSION/DISCECTOMY FUSION 2 LEVELS;  Surgeon: [name], MD;  Location: [facility];  Service: Neurosurgery;  Laterality: Bilateral;  Cervical five-six, six-seven anterior cervical discectomy with fusion  . EYE SURGERY    . lasic    . SPINE SURGERY    . TONSILLECTOMY     Current Outpatient Medications on File Prior to Visit  Medication Sig Dispense Refill  . allopurinol (ZYLOPRIM) 300 MG tablet TAKE 1 TABLET(300 MG) BY MOUTH DAILY 90 tablet 3  . Colchicine 0.6 MG CAPS TAKE 2 CAPSULES BY MOUTH AT FIRST SIGN OF GOUT THEN 1 CAPSULE BY MOUTH IN 1 HOUR IF PAIN CONTINUES 30 capsule 3  . omeprazole (PRILOSEC) 40 MG capsule TAKE 1  CAPSULE(40 MG) BY MOUTH DAILY 90 capsule 3  . pravastatin (PRAVACHOL) 20 MG tablet TAKE 1 TABLET(20 MG) BY MOUTH DAILY AT 6 PM 30 tablet 0   No current facility-administered medications on file prior to visit.   No Known Allergies Social History   Socioeconomic History  . Marital status: Married    Spouse name: Not on file  . Number of children: Not on file  . Years of education: 64yr [employer]  . Highest education level: Not on file  Occupational History  . Occupation: farm work   Tobacco Use  . Smoking status: Current Some Day Smoker    Packs/day: 0.25    Types: Cigarettes  . Smokeless tobacco: Never Used  Substance and Sexual Activity  . Alcohol use: Yes    Alcohol/week: 12.0 standard drinks    Types: 12 Cans of beer per week  . Drug use: No  . Sexual activity: Yes  Other Topics Concern  . Not on file  Social History Narrative  . Not on file   Social Determinants of Health   Financial Resource Strain:   . Difficulty of Paying Living Expenses: Not on file  Food Insecurity:   . Worried About Programme researcher, broadcasting/film/video in the Last Year: Not on file  . Ran Out of Food in the Last Year: Not on file  Transportation Needs:   . Lack of  Transportation (Medical): Not on file  . Lack of Transportation (Non-Medical): Not on file  Physical Activity:   . Days of Exercise per Week: Not on file  . Minutes of Exercise per Session: Not on file  Stress:   . Feeling of Stress : Not on file  Social Connections:   . Frequency of Communication with Friends and Family: Not on file  . Frequency of Social Gatherings with Friends and Family: Not on file  . Attends Religious Services: Not on file  . Active Member of Clubs or Organizations: Not on file  . Attends Banker Meetings: Not on file  . Marital Status: Not on file  Intimate Partner Violence:   . Fear of Current or Ex-Partner: Not on file  . Emotionally Abused: Not on file  . Physically Abused: Not on file  . Sexually Abused:  Not on file    Family History  Problem Relation Age of Onset  . Cancer Unknown        family history   . Diabetes Unknown        family history   . Cancer Mother   . Cancer Maternal Grandfather   . Cancer Paternal Grandfather      Review of Systems  All other systems reviewed and are negative.      Objective:   Physical Exam Vitals reviewed.  Constitutional:      General: He is not in acute distress.    Appearance: Normal appearance. He is well-developed. He is obese. He is not ill-appearing, toxic-appearing or diaphoretic.  HENT:     Head: Normocephalic and atraumatic.     Right Ear: External ear normal.     Left Ear: External ear normal.     Nose: Nose normal.     Mouth/Throat:     Mouth: Mucous membranes are moist.     Pharynx: Oropharynx is clear. No oropharyngeal exudate or posterior oropharyngeal erythema.  Eyes:     Extraocular Movements: Extraocular movements intact.     Conjunctiva/sclera: Conjunctivae normal.     Pupils: Pupils are equal, round, and reactive to light.  Cardiovascular:     Rate and Rhythm: Normal rate and regular rhythm.     Heart sounds: Normal heart sounds. No murmur heard.  No friction rub. No gallop.   Pulmonary:     Effort: Pulmonary effort is normal. No respiratory distress.     Breath sounds: Normal breath sounds. No stridor. No wheezing, rhonchi or rales.  Chest:     Chest wall: No tenderness.  Abdominal:     General: Bowel sounds are normal. There is no distension.     Palpations: Abdomen is soft. There is no mass.     Tenderness: There is no abdominal tenderness. There is no guarding or rebound.     Hernia: No hernia is present.  Musculoskeletal:     Right lower leg: No edema.     Left lower leg: No edema.  Skin:    General: Skin is warm.     Coloration: Skin is not jaundiced or pale.     Findings: No bruising, erythema, lesion or rash.  Neurological:     General: No focal deficit present.     Mental Status: He is alert  and oriented to person, place, and time. Mental status is at baseline.     Cranial Nerves: No cranial nerve deficit.     Sensory: No sensory deficit.     Motor: No weakness.  Coordination: Coordination normal.     Gait: Gait normal.  Psychiatric:        Mood and Affect: Mood normal.        Behavior: Behavior normal.        Thought Content: Thought content normal.        Judgment: Judgment normal.           Assessment & Plan:  Dyspnea on exertion - Plan: DG Chest 2 View, CBC with Differential/Platelet, COMPLETE METABOLIC PANEL WITH GFR, D-dimer, quantitative (not at [facility]), CANCELED: D-dimer, quantitative (not at [facility])  Pure hypercholesterolemia - Plan: Lipid panel, CANCELED: CBC with Differential/Platelet, CANCELED: COMPLETE METABOLIC PANEL WITH GFR  COVID-19 - Plan: CBC with Differential/Platelet, COMPLETE METABOLIC PANEL WITH GFR, D-dimer, quantitative (not at [facility])  Fatty liver disease, nonalcoholic  History of gout  Smoker  General medical exam  My biggest concern is a pulmonary embolism.  I will send off a D-dimer stat.  If positive, the patient will need a CTA scan of the chest to evaluate for pulmonary embolism.  Check CBC to evaluate for anemia.  Check CMP to evaluate liver function test along with renal function.  Obtain chest x-ray.  However lungs sound completely clear to auscultation bilaterally therefore I do not believe that there is pneumonia.  While checking lab work I will also check his cholesterol.  Patient has refrained from smoking for the last 3 to 4 weeks.  He is also been abstinent from alcohol.  I congratulated him on that.  However my biggest concern at this point again would be a pulmonary embolism.

## 2019-10-01 NOTE — Discharge Instructions (Addendum)
Your work-up today was overall reassuring.  There was no evidence of any heart attack or blood clots in your bloodwork.  Please make sure to drink plenty of fluids, and return to the ER if your symptoms worsen.  As discussed, please follow-up with your primary care doctor, or you may also call the cardiology doctor whose contact information I provided.

## 2019-10-01 NOTE — ED Provider Notes (Addendum)
Sergio Gonzalez   CSN: [id number] Arrival date & time: 10/01/19  1854     History Chief Complaint  Patient presents with   Shortness of Breath   Chest Pain    Sergio Gonzalez is a 44 y.o. male.  HPI 44 year old male with a history of GERD, hiatal hernia, hyperlipidemia, AVN of the femur head presents to the ER with complaints of centralized chest pain with radiation to the left arm and shortness of breath x2 days.  Patient was diagnosed with Covid approximately 3 weeks ago.  Follow-up with his PCP today as he was having some worsening shortness of breath, stating "I just feel like I cannot get a proper breath in".  He also states that he has been having some chest pain, worse with ambulation but sometimes at rest as well.  He followed up with his PCP Dr. Tanya NonesPickard who was concerned about a PE.  He had a chest x-ray done and a D-dimer drawn.  Per chart review, D-dimer was negative and chest x-ray did not show any evidence of pneumonia or any other cardiopulmonary abnormality.  He stated that his PCP did not to come, however his family members were growing increasingly concerned over his symptoms and stressed that he come to the ER.  Denies any radiation of the chest pain, no episodes of diaphoresis.  No previous cardiac history or work-ups.  Denies any abdominal pain, back pain, headache, dizziness, syncope.  Denies any fevers, chills, cough.  No history of PE.  He endorses a history of smoking, but states he has not smoked over the last 3 to 4 weeks since his Covid diagnosis.    Past Medical History:  Diagnosis Date   Avascular necrosis of femur head, right (HCC)    GERD (gastroesophageal reflux disease)    H/O hiatal hernia    Hyperlipidemia    Smoker     Patient Active Problem List   Diagnosis Date Noted   Smoker    CLOSED FRACTURE OF LATERAL MALLEOLUS 03/04/2010   HIP PAIN 04/18/2007    Past Surgical History:  Procedure  Laterality Date   ANTERIOR CERVICAL DECOMP/DISCECTOMY FUSION  08/20/2011   Procedure: ANTERIOR CERVICAL DECOMPRESSION/DISCECTOMY FUSION 2 LEVELS;  Surgeon: Reinaldo Meekerandy O Kritzer, MD;  Location: [facility];  Service: Neurosurgery;  Laterality: Bilateral;  Cervical five-six, six-seven anterior cervical discectomy with fusion   EYE SURGERY     lasic     SPINE SURGERY     TONSILLECTOMY         Family History  Problem Relation Age of Onset   Cancer Unknown        family history    Diabetes Unknown        family history    Cancer Mother    Cancer Maternal Grandfather    Cancer Paternal Grandfather     Social History   Tobacco Use   Smoking status: Current Some Day Smoker    Packs/day: 0.25    Types: Cigarettes   Smokeless tobacco: Never Used  Substance Use Topics   Alcohol use: Yes    Alcohol/week: 12.0 standard drinks    Types: 12 Cans of beer per week   Drug use: No    Home Medications Prior to Admission medications   Medication Sig Start Date End Date Taking? Authorizing Provider  allopurinol (ZYLOPRIM) 300 MG tablet TAKE 1 TABLET(300 MG) BY MOUTH DAILY 09/26/19   Donita BrooksPickard, Warren T, MD  Colchicine 0.6 MG  CAPS TAKE 2 CAPSULES BY MOUTH AT FIRST SIGN OF GOUT THEN 1 CAPSULE BY MOUTH IN 1 HOUR IF PAIN CONTINUES 03/14/19   [name], MD  omeprazole (PRILOSEC) 40 MG capsule TAKE 1 CAPSULE(40 MG) BY MOUTH DAILY 09/26/19   [name], MD  pravastatin (PRAVACHOL) 20 MG tablet TAKE 1 TABLET(20 MG) BY MOUTH DAILY AT 6 PM 09/26/19   [name], MD    Allergies    Patient has no known allergies.  Review of Systems   Review of Systems  Constitutional: Negative for chills and fever.  HENT: Negative for ear pain and sore throat.   Eyes: Negative for pain and visual disturbance.  Respiratory: Positive for shortness of breath. Negative for cough.   Cardiovascular: Positive for chest pain. Negative for palpitations and leg swelling.  Gastrointestinal:  Negative for abdominal pain and vomiting.  Genitourinary: Negative for dysuria and hematuria.  Musculoskeletal: Negative for arthralgias and back pain.  Skin: Negative for color change and rash.  Neurological: Negative for seizures and syncope.  All other systems reviewed and are negative.   Physical Exam Updated Vital Signs BP (!) 125/95 (BP Location: Right Arm)    Pulse 68    Temp 98.9 F (37.2 C) (Oral)    Resp 18    SpO2 100%   Physical Exam Vitals and nursing Gonzalez reviewed.  Constitutional:      General: He is not in acute distress.    Appearance: He is well-developed. He is obese. He is not ill-appearing, toxic-appearing or diaphoretic.     Comments: Well-appearing, nondiaphoretic, speaking full sentences with out increased work of breathing  HENT:     Head: Normocephalic and atraumatic.  Eyes:     Conjunctiva/sclera: Conjunctivae normal.  Cardiovascular:     Rate and Rhythm: Normal rate and regular rhythm.     Heart sounds: No murmur heard.   Pulmonary:     Effort: Pulmonary effort is normal. No respiratory distress.     Breath sounds: Normal breath sounds. No decreased breath sounds, wheezing, rhonchi or rales.     Comments: Respirations equal and unlabored, patient able to speak in full sentences, lungs clear to auscultation bilaterally  Chest:     Chest wall: No tenderness.  Abdominal:     Palpations: Abdomen is soft.     Tenderness: There is no abdominal tenderness.  Musculoskeletal:        General: Normal range of motion.     Cervical back: Normal range of motion and neck supple.     Right lower leg: No tenderness. No edema.     Left lower leg: No tenderness. No edema.     Comments: No noticeable swelling, erythema, tenderness to LE bilaterally   Skin:    General: Skin is warm and dry.     Capillary Refill: Capillary refill takes less than 2 seconds.  Neurological:     General: No focal deficit present.     Mental Status: He is alert.     Comments: Moving  all 4 extremities without difficulty. 2+ radial pulses bilaterally   Psychiatric:        Mood and Affect: Mood normal.        Behavior: Behavior normal.     ED Results / Procedures / Treatments   Labs (all labs ordered are listed, but only abnormal results are displayed) Labs Reviewed  BASIC METABOLIC PANEL - Abnormal; Notable for the following components:      Result Value  CO2 20 (*)    Glucose, Bld 106 (*)    All other components within normal limits  CBC  TROPONIN I (HIGH SENSITIVITY)  TROPONIN I (HIGH SENSITIVITY)    EKG None ED ECG REPORT   Date: 10/01/2019  Rate: 63    Rhythm: normal sinus rhythm  QRS Axis: normal  Intervals: normal  ST/T Wave abnormalities: normal  Conduction Disutrbances:none  Narrative Interpretation:   Old EKG Reviewed: none available  I have personally reviewed the EKG tracing and agree with the computerized printout as noted.   Radiology DG Chest 2 View  Result Date: 10/01/2019 CLINICAL DATA:  Dyspnea on exertion for 3 weeks.  Current smoker. EXAM: CHEST - 2 VIEW COMPARISON:  None. FINDINGS: Surgical hardware from ACDF partially visualized in the lower cervical spine. Stable cardiomediastinal silhouette with normal heart size. No pneumothorax. No pleural effusion. Lungs appear clear, with no acute consolidative airspace disease and no pulmonary edema. IMPRESSION: No active cardiopulmonary disease. Electronically Signed   By: Delbert Phenix M.D.   On: 10/01/2019 15:52    Procedures Procedures (including critical care time)  Medications Ordered in ED Medications - No data to display  ED Course  I have reviewed the triage vital signs and the nursing notes.  Pertinent labs & imaging results that were available during my care of the patient were reviewed by me and considered in my medical decision making (see chart for details).    MDM Rules/Calculators/A&P                         Patient presents with worsening chest pain or shortness  of breath over the last several days with a positive Covid test 21 days ago On presentation, he is alert, oriented, nontoxic-appearing, no acute distress, speaking full sentences, without increased work of breathing, nondiaphoretic.  Vitals overall reassuring, he is not tachycardic, hypotensive, or febrile.  Oxygen saturations are 100%.  Physical exam with clear lung sounds, soft and nontender abdomen.  No reproducible chest wall pain.  DDX includes: ACS, PE, myocarditis/ pericarditis, pneumonia, anemia, CHF, dissection    LABS:  I personally reviewed and interpreted his lab work BMP without any significant electrolyte abnormalities, mildly increased glucose of 1.06.  Normal renal function and GFR. CBC without leukocytosis, normal hemoglobin. Delta troponin negative  EKG reviewed by Dr. Rubin Payor normal sinus rhythm, no ST elevations or T wave inversions.  MDM:  Delta troponin negative, normal EKG not suggestive of ACS.  I reviewed the D-dimer from the previous provider earlier today, which was negative.  No indication for CT scan at this time given negative dimer.  No evidence of heart failure, no swelling of the lower extremities. Doubt dissection given no back pain and reassuring vitals.  Chest x-ray reviewed also which was ordered by PCP, which was negative for a pneumonia.  No evidence of anemia, myocarditis or pericarditis.  Overall work-up reassuring.  I discussed these findings with the patient who is overall reassured.  We discussed following up with his PCP which the patient preferred, however also provided cardiology referral for further work-up if his symptoms persist.  Very strict return precautions discussed, which he voiced understanding and is agreeable to.  DISPOSITION: Stable for discharge with close follow-up with PCP/cardiology. Referral for cardiology referral provided.   Strict return precautions discussed.  All the patient's questions have been answered to his satisfaction, he  voices understanding and is agreeable to this plan.  Discussed the case with  Dr. Rubin Payor who is agreeable to the above plan and disposition.  Final Clinical Impression(s) / ED Diagnoses Final diagnoses:  Chest pain, unspecified type    Rx / DC Orders ED Discharge Orders    None           Leone Brand 10/01/19 2346    Benjiman Core, MD 10/02/19 [phone]

## 2019-10-02 ENCOUNTER — Telehealth: Payer: Self-pay | Admitting: Family Medicine

## 2019-10-02 ENCOUNTER — Other Ambulatory Visit: Payer: Self-pay | Admitting: Student

## 2019-10-02 ENCOUNTER — Encounter: Payer: Self-pay | Admitting: Family Medicine

## 2019-10-02 NOTE — Telephone Encounter (Signed)
CB# [phone] Wife calling concerning her Sergio Gonzalez CT also still having chest pain. Wife stated that they where in [facility] for hours they said that he wasn't  sick enough to be admit

## 2019-10-03 ENCOUNTER — Ambulatory Visit
Admission: RE | Admit: 2019-10-03 | Discharge: 2019-10-03 | Disposition: A | Discharge status: Home / Self Care | Payer: Self-pay | Source: Ambulatory Visit | Attending: Family Medicine | Admitting: Family Medicine

## 2019-10-03 ENCOUNTER — Other Ambulatory Visit: Payer: Self-pay

## 2019-10-03 DIAGNOSIS — U071 COVID-19: Secondary | ICD-10-CM

## 2019-10-03 DIAGNOSIS — R071 Chest pain on breathing: Secondary | ICD-10-CM

## 2019-10-03 DIAGNOSIS — R0600 Dyspnea, unspecified: Secondary | ICD-10-CM

## 2019-10-03 DIAGNOSIS — R0609 Other forms of dyspnea: Secondary | ICD-10-CM

## 2019-10-03 MED ORDER — IOPAMIDOL (ISOVUE-370) INJECTION 76%
50.0000 mL | Freq: Once | INTRAVENOUS | Status: AC | PRN
  Administered 2019-10-03: 50 mL via INTRAVENOUS

## 2019-10-03 MED ORDER — IOPAMIDOL (ISOVUE-370) INJECTION 76%
75.0000 mL | Freq: Once | INTRAVENOUS | Status: AC | PRN
  Administered 2019-10-03: 75 mL via INTRAVENOUS

## 2019-10-04 ENCOUNTER — Other Ambulatory Visit: Payer: Self-pay | Admitting: Family Medicine

## 2019-10-04 DIAGNOSIS — R0600 Dyspnea, unspecified: Secondary | ICD-10-CM

## 2019-10-04 DIAGNOSIS — R0609 Other forms of dyspnea: Secondary | ICD-10-CM

## 2019-10-05 ENCOUNTER — Telehealth: Payer: Self-pay | Admitting: Internal Medicine

## 2019-10-05 ENCOUNTER — Encounter: Payer: Self-pay | Admitting: Internal Medicine

## 2019-10-05 ENCOUNTER — Ambulatory Visit: Payer: Self-pay | Admitting: Cardiology

## 2019-10-05 NOTE — Telephone Encounter (Signed)
Patient was scheduled to see Dr. Cristal Deer today as a NP - the appt was cancelled because typically Dr. Cristal Deer does not like NP appts scheduled for same day. Patient was then rescheduled for 10/15/19 with Dr. Izora Ribas. Patient advised his PCP who sent over the urgent referral and he stated that this was unacceptable and that he needed to be seen sooner. I do not see any open slots at [facility] or [facility]. Patient wants to know if anything can be done for him to get in next week because he believes he will be out of town the week of the 13th. Please advise.

## 2019-10-05 NOTE — Telephone Encounter (Signed)
Spoke with pt, appointment rescheduled to next week with dr Cristal Deer.

## 2019-10-05 NOTE — Telephone Encounter (Signed)
Print production planner scheduled Mr. Caldron for CT scan

## 2019-10-12 ENCOUNTER — Encounter: Payer: Self-pay | Admitting: Cardiology

## 2019-10-12 ENCOUNTER — Ambulatory Visit (INDEPENDENT_AMBULATORY_CARE_PROVIDER_SITE_OTHER): Payer: No Typology Code available for payment source | Admitting: Cardiology

## 2019-10-12 ENCOUNTER — Other Ambulatory Visit: Payer: Self-pay

## 2019-10-12 VITALS — BP 112/80 | HR 77 | Ht 76.0 in | Wt 284.2 lb

## 2019-10-12 DIAGNOSIS — R072 Precordial pain: Secondary | ICD-10-CM

## 2019-10-12 DIAGNOSIS — Z716 Tobacco abuse counseling: Secondary | ICD-10-CM | POA: Diagnosis not present

## 2019-10-12 DIAGNOSIS — R0600 Dyspnea, unspecified: Secondary | ICD-10-CM

## 2019-10-12 DIAGNOSIS — R0609 Other forms of dyspnea: Secondary | ICD-10-CM

## 2019-10-12 DIAGNOSIS — Z8249 Family history of ischemic heart disease and other diseases of the circulatory system: Secondary | ICD-10-CM | POA: Insufficient documentation

## 2019-10-12 DIAGNOSIS — Z8616 Personal history of COVID-19: Secondary | ICD-10-CM

## 2019-10-12 DIAGNOSIS — Z7189 Other specified counseling: Secondary | ICD-10-CM

## 2019-10-12 NOTE — Progress Notes (Signed)
Cardiology Office Note:    Date:  10/12/2019   ID:  Sergio Gonzalez, DOB January 12, 1976, MRN [id number]  PCP:  [name], MD  Cardiologist:  Jodelle Red, MD  Referring MD: [name], MD   CC: new patient evaluation for shortness of breath and chest pain  History of Present Illness:    Sergio Gonzalez is a 44 y.o. male with a hx of hyperlipidemia, hiatal hernia/GERD, AVN of femur, former tobacco use, personal history of Covid who is seen as a new consult at the request of [name], MD for the evaluation and management of shortness of breath and chest pain.  Note from Dr. Tanya Nones from 10/01/19 reviewed. Also reviewed ER note from 10/01/19. Noted worsening shortness of breath/chest pain since Covid diagnosis early August (3 weeks at time of visits). HsTni and ECG unremarkable.  Chest pain: -Initial onset, quality, frequency/duration: was doing very well until he got Covid. Had cough, fever, body aches and then developed shortness of breath and chest discomfort. Feels like he is improved since a week ago, but still has chest pressure and mild shortness of breath. Had been constant, now more intermittent. 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  -Associated symptoms: pressure/shortness of breath related, one time felt sweaty, one time left arm felt off. -Aggravating/alleviating factors: improving with time -Prior cardiac history: none, no prior workup or treatment recently. Had a stress test in high school, told he had muscle spasms -Alcohol: none in a long time, but when he used to >10 years ago it was binge drinking -Tobacco: more when he is drinking, now less than 1 cigarette/day -Comorbidities: high cholesterol, has been on pravastatin since mid-30s. -Exercise level: is a farmer (tobacco, corn, soy, wheat), usually very active but has not been doing much more than walking -Cardiac ROS: no PND, no orthopnea, no LE edema, no syncope -Family history: Griselda Miner had a  pacemaker. Pat uncle had MI, died age 68. Other pat uncle with a mild stroke. Both uncles were heavy smokers/drinkers. Nothing on his mother's side. There is cancer on both sides.  Past Medical History:  Diagnosis Date  . Avascular necrosis of femur head, right (HCC)   . GERD (gastroesophageal reflux disease)   . H/O hiatal hernia   . Hyperlipidemia   . Smoker     Past Surgical History:  Procedure Laterality Date  . ANTERIOR CERVICAL DECOMP/DISCECTOMY FUSION  08/20/2011   Procedure: ANTERIOR CERVICAL DECOMPRESSION/DISCECTOMY FUSION 2 LEVELS;  Surgeon: [name], MD;  Location: [facility];  Service: Neurosurgery;  Laterality: Bilateral;  Cervical five-six, six-seven anterior cervical discectomy with fusion  . EYE SURGERY    . lasic    . SPINE SURGERY    . TONSILLECTOMY      Current Medications: Current Outpatient Medications on File Prior to Visit  Medication Sig  . allopurinol (ZYLOPRIM) 300 MG tablet TAKE 1 TABLET(300 MG) BY MOUTH DAILY  . Colchicine 0.6 MG CAPS TAKE 2 CAPSULES BY MOUTH AT FIRST SIGN OF GOUT THEN 1 CAPSULE BY MOUTH IN 1 HOUR IF PAIN CONTINUES  . omeprazole (PRILOSEC) 40 MG capsule TAKE 1 CAPSULE(40 MG) BY MOUTH DAILY  . pravastatin (PRAVACHOL) 20 MG tablet TAKE 1 TABLET(20 MG) BY MOUTH DAILY AT 6 PM   No current facility-administered medications on file prior to visit.     Allergies:   Patient has no known allergies.   Social History   Tobacco Use  . Smoking status:  Current Some Day Smoker    Packs/day: 0.25    Types: Cigarettes  . Smokeless tobacco: Never Used  Substance Use Topics  . Alcohol use: Yes    Alcohol/week: 12.0 standard drinks    Types: 12 Cans of beer per week  . Drug use: No    Family History: family history includes Cancer in his maternal grandfather, mother, paternal grandfather, and unknown relative; Diabetes in his unknown relative.  ROS:   Please see the history of present illness.  Additional pertinent  ROS: Constitutional: Negative for chills, fever, night sweats, unintentional weight loss  HENT: Negative for ear pain and hearing loss.   Eyes: Negative for loss of vision and eye pain.  Respiratory: Negative for cough, sputum, wheezing.   Cardiovascular: See HPI. Gastrointestinal: Negative for abdominal pain, melena, and hematochezia.  Genitourinary: Negative for dysuria and hematuria.  Musculoskeletal: Negative for falls and myalgias.  Skin: Negative for itching and rash.  Neurological: Negative for focal weakness, focal sensory changes and loss of consciousness.  Endo/Heme/Allergies: Does not bruise/bleed easily.     EKGs/Labs/Other Studies Reviewed:    The following studies were reviewed today: No prior cardiac studies  EKG:  EKG is personally reviewed.  The ekg ordered today demonstrates normal sinus rhythm at 77 bpm with incomplete RBBB  Recent Labs: 10/01/2019: ALT 64; BUN 13; Creatinine, Ser 1.02; Hemoglobin 15.1; Platelets 177; Potassium 3.7; Sodium 139  Recent Lipid Panel    Component Value Date/Time   CHOL 197 10/01/2019 0855   TRIG 240 (H) 10/01/2019 0855   HDL 37 (L) 10/01/2019 0855   CHOLHDL 5.3 (H) 10/01/2019 0855   VLDL 29 09/24/2016 0918   LDLCALC 121 (H) 10/01/2019 0855    Physical Exam:    VS:  BP 112/80   Pulse 77   Ht 6\' 4"  (1.93 m)   Wt 284 lb 3.2 oz (128.9 kg)   SpO2 97%   BMI 34.59 kg/m     Wt Readings from Last 3 Encounters:  10/12/19 284 lb 3.2 oz (128.9 kg)  10/01/19 280 lb (127 kg)  05/29/19 287 lb (130.2 kg)    GEN: Well nourished, well developed in no acute distress HEENT: Normal, moist mucous membranes NECK: No JVD CARDIAC: regular rhythm, normal S1 and S2, no rubs or gallops. No murmurs. VASCULAR: Radial and DP pulses 2+ bilaterally. No carotid bruits RESPIRATORY:  Clear to auscultation without rales, wheezing or rhonchi  ABDOMEN: Soft, non-tender, non-distended MUSCULOSKELETAL:  Ambulates independently SKIN: Warm and dry, no  edema NEUROLOGIC:  Alert and oriented x 3. No focal neuro deficits noted. PSYCHIATRIC:  Normal affect    ASSESSMENT:    1. Precordial pain   2. Dyspnea on exertion   3. Personal history of covid-19   4. Tobacco abuse counseling   5. Family history of heart disease   6. Cardiac risk counseling   7. Counseling on health promotion and disease prevention    PLAN:    Chest pain: -discussed treadmill stress, nuclear stress/lexiscan, and CT coronary angiography. Discussed pros and cons of each, including but not limited to false positive/false negative risk, radiation risk, and risk of IV contrast dye. Based on shared decision making, decision was made to not pursue testing at this time -he feels his symptoms are improving post Covid, but if they stop improving he will contact me. We will pursue CT cardiac at that time -counseled on red flag warning signs that need immediate medical attention  Dyspnea on exertion Personal history of  covid-19 -will check echocardiogream  Tobacco abuse counseling: The patient was counseled on tobacco cessation today for 4 minutes.  Counseling included reviewing the risks of smoking tobacco products, how it impacts the patient's current medical diagnoses and different strategies for quitting.  Pharmacotherapy to aid in tobacco cessation was not prescribed today.  Cardiac risk counseling and prevention recommendations: with family history of heart disease -recommend heart healthy/Mediterranean diet, with whole grains, fruits, vegetable, fish, lean meats, nuts, and olive oil. Limit salt. -recommend moderate walking, 3-5 times/week for 30-50 minutes each session. Aim for at least 150 minutes.week. Goal should be pace of 3 miles/hours, or walking 1.5 miles in 30 minutes -recommend avoidance of tobacco products. Avoid excess alcohol. -ASCVD risk score: The 10-year ASCVD risk score Denman George DC Montez Hageman., et al., 2013) is: 5.4%   Values used to calculate the score:     Age:  20 years     Sex: Male     Is Non-Hispanic African American: No     Diabetic: No     Tobacco smoker: Yes     Systolic Blood Pressure: 112 mmHg     Is BP treated: No     HDL Cholesterol: 37 mg/dL     Total Cholesterol: 197 mg/dL    Plan for follow up: we discussed options. He would like to follow up PRN, but if chest pressure/shortness of breath worsens, call and would get cardiac CT  Jodelle Red, MD, PhD [place]  [facility]    Medication Adjustments/Labs and Tests Ordered: Current medicines are reviewed at length with the patient today.  Concerns regarding medicines are outlined above.  Orders Placed This Encounter  Procedures  . EKG 12-Lead  . ECHOCARDIOGRAM COMPLETE   No orders of the defined types were placed in this encounter.   Patient Instructions  Medication Instructions:  Your Physician recommend you continue on your current medication as directed.    *If you need a refill on your cardiac medications before your next appointment, please call your pharmacy*   Lab Work: None ordered  Testing/Procedures: Your physician has requested that you have an echocardiogram. Echocardiography is a painless test that uses sound waves to create images of your heart. It provides your doctor with information about the size and shape of your heart and how well your heart's chambers and valves are working. This procedure takes approximately one hour. There are no restrictions for this procedure. [address]    Follow-Up: At [facility], you and your health needs are our priority.  As part of our continuing mission to provide you with exceptional heart care, we have created designated Provider Care Teams.  These Care Teams include your primary Cardiologist (physician) and Advanced Practice Providers (APPs -  Physician Assistants and Nurse Practitioners) who all work together to provide you with the care you need, when you need it.  We recommend  signing up for the patient portal called "MyChart".  Sign up information is provided on this After Visit Summary.  MyChart is used to connect with patients for Virtual Visits (Telemedicine).  Patients are able to view lab/test results, encounter notes, upcoming appointments, etc.  Non-urgent messages can be sent to your provider as well.   To learn more about what you can do with MyChart, go to [email or web address].    Your next appointment:   As needed  The format for your next appointment:   In Person  Provider:   Jodelle Red, MD  Signed, Jodelle RedBridgette Raihana Balderrama, MD PhD 10/12/2019  [facility]

## 2019-10-12 NOTE — Patient Instructions (Signed)
Medication Instructions:  Your Physician recommend you continue on your current medication as directed.    *If you need a refill on your cardiac medications before your next appointment, please call your pharmacy*   Lab Work: None ordered   Testing/Procedures: Your physician has requested that you have an echocardiogram. Echocardiography is a painless test that uses sound waves to create images of your heart. It provides your doctor with information about the size and shape of your heart and how well your heart's chambers and valves are working. This procedure takes approximately one hour. There are no restrictions for this procedure. [address]   Follow-Up: At [facility], you and your health needs are our priority.  As part of our continuing mission to provide you with exceptional heart care, we have created designated Provider Care Teams.  These Care Teams include your primary Cardiologist (physician) and Advanced Practice Providers (APPs -  Physician Assistants and Nurse Practitioners) who all work together to provide you with the care you need, when you need it.  We recommend signing up for the patient portal called "MyChart".  Sign up information is provided on this After Visit Summary.  MyChart is used to connect with patients for Virtual Visits (Telemedicine).  Patients are able to view lab/test results, encounter notes, upcoming appointments, etc.  Non-urgent messages can be sent to your provider as well.   To learn more about what you can do with MyChart, go to [email or web address].    Your next appointment:   As needed  The format for your next appointment:   In Person  Provider:   Bridgette Christopher, MD     

## 2019-10-15 ENCOUNTER — Ambulatory Visit: Payer: Self-pay | Admitting: Internal Medicine

## 2019-10-19 ENCOUNTER — Other Ambulatory Visit (HOSPITAL_COMMUNITY): Payer: Self-pay

## 2019-10-23 ENCOUNTER — Other Ambulatory Visit: Payer: Self-pay | Admitting: Family Medicine

## 2019-10-24 ENCOUNTER — Ambulatory Visit (HOSPITAL_COMMUNITY): Payer: No Typology Code available for payment source | Attending: Cardiology

## 2019-10-24 ENCOUNTER — Other Ambulatory Visit: Payer: Self-pay

## 2019-10-24 DIAGNOSIS — R0609 Other forms of dyspnea: Secondary | ICD-10-CM

## 2019-10-24 DIAGNOSIS — R072 Precordial pain: Secondary | ICD-10-CM | POA: Diagnosis present

## 2019-10-24 DIAGNOSIS — R0600 Dyspnea, unspecified: Secondary | ICD-10-CM

## 2019-10-24 LAB — ECHOCARDIOGRAM COMPLETE
Area-P 1/2: 3.58 cm2
S' Lateral: 3.05 cm

## 2019-11-22 ENCOUNTER — Other Ambulatory Visit: Payer: Self-pay | Admitting: Family Medicine

## 2019-12-22 ENCOUNTER — Other Ambulatory Visit: Payer: Self-pay | Admitting: Family Medicine

## 2020-01-21 ENCOUNTER — Other Ambulatory Visit: Payer: Self-pay | Admitting: Family Medicine

## 2020-02-20 ENCOUNTER — Other Ambulatory Visit: Payer: Self-pay | Admitting: Family Medicine

## 2020-04-08 ENCOUNTER — Other Ambulatory Visit: Payer: Self-pay | Admitting: Family Medicine

## 2020-04-09 ENCOUNTER — Telehealth: Payer: Self-pay

## 2020-04-09 ENCOUNTER — Other Ambulatory Visit: Payer: Self-pay | Admitting: Family Medicine

## 2020-04-09 MED ORDER — COLCHICINE 0.6 MG PO CAPS: ORAL_CAPSULE | ORAL | 3 refills | Status: DC

## 2020-04-09 NOTE — Telephone Encounter (Signed)
Pharmacy faxed order for med refill Colchicine 0.6 MG   Pharmacy:  [facility]

## 2020-04-09 NOTE — Addendum Note (Signed)
Addended by: Phillips Odor on: 04/09/2020 06:07 PM   Modules accepted: Orders

## 2020-04-09 NOTE — Telephone Encounter (Signed)
Prescription sent to pharmacy.

## 2020-04-15 ENCOUNTER — Other Ambulatory Visit: Payer: Self-pay | Admitting: *Deleted

## 2020-04-15 MED ORDER — COLCHICINE 0.6 MG PO TABS: ORAL_TABLET | ORAL | 1 refills | Status: DC

## 2020-08-06 ENCOUNTER — Other Ambulatory Visit: Payer: Self-pay | Admitting: Family Medicine

## 2020-09-01 ENCOUNTER — Telehealth: Payer: Self-pay

## 2020-09-01 MED ORDER — ALLOPURINOL 300 MG PO TABS: ORAL_TABLET | ORAL | 0 refills | Status: DC

## 2020-09-01 MED ORDER — OMEPRAZOLE 40 MG PO CPDR: DELAYED_RELEASE_CAPSULE | ORAL | 0 refills | Status: DC

## 2020-09-01 MED ORDER — PRAVASTATIN SODIUM 20 MG PO TABS
20.0000 mg | ORAL_TABLET | Freq: Every day | ORAL | 0 refills | Status: DC
Start: 2020-09-01 — End: 2020-09-29

## 2020-09-01 NOTE — Telephone Encounter (Signed)
Prescription sent to pharmacy.

## 2020-09-01 NOTE — Telephone Encounter (Signed)
Pt called in requesting a refill of all meds. Pt has scheduled an annual physical for 10/09/20 but will not have enough meds to last until that date. Please advise.  Cb# [phone]

## 2020-09-26 ENCOUNTER — Other Ambulatory Visit: Payer: Self-pay

## 2020-09-26 DIAGNOSIS — Z136 Encounter for screening for cardiovascular disorders: Secondary | ICD-10-CM

## 2020-09-26 DIAGNOSIS — Z1159 Encounter for screening for other viral diseases: Secondary | ICD-10-CM

## 2020-09-26 DIAGNOSIS — Z0000 Encounter for general adult medical examination without abnormal findings: Secondary | ICD-10-CM

## 2020-09-26 DIAGNOSIS — K760 Fatty (change of) liver, not elsewhere classified: Secondary | ICD-10-CM

## 2020-09-26 DIAGNOSIS — Z13220 Encounter for screening for lipoid disorders: Secondary | ICD-10-CM

## 2020-09-26 DIAGNOSIS — E7800 Pure hypercholesterolemia, unspecified: Secondary | ICD-10-CM

## 2020-09-28 ENCOUNTER — Other Ambulatory Visit: Payer: Self-pay | Admitting: Family Medicine

## 2020-09-29 LAB — COMPLETE METABOLIC PANEL WITH GFR
AG Ratio: 1.9 (calc) (ref 1.0–2.5)
ALT: 72 U/L — ABNORMAL HIGH (ref 9–46)
AST: 32 U/L (ref 10–40)
Albumin: 4.3 g/dL (ref 3.6–5.1)
Alkaline phosphatase (APISO): 88 U/L (ref 36–130)
BUN: 13 mg/dL (ref 7–25)
CO2: 25 mmol/L (ref 20–32)
Calcium: 9.2 mg/dL (ref 8.6–10.3)
Chloride: 102 mmol/L (ref 98–110)
Creat: 0.98 mg/dL (ref 0.60–1.29)
Globulin: 2.3 g/dL (calc) (ref 1.9–3.7)
Glucose, Bld: 94 mg/dL (ref 65–99)
Potassium: 4.2 mmol/L (ref 3.5–5.3)
Sodium: 139 mmol/L (ref 135–146)
Total Bilirubin: 0.7 mg/dL (ref 0.2–1.2)
Total Protein: 6.6 g/dL (ref 6.1–8.1)
eGFR: 98 mL/min/{1.73_m2} (ref >=60)

## 2020-09-29 LAB — CBC WITH DIFFERENTIAL/PLATELET
Absolute Monocytes: 486 cells/uL (ref 200–950)
Basophils Absolute: 42 cells/uL (ref 0–200)
Basophils Relative: 0.7 %
Eosinophils Absolute: 144 cells/uL (ref 15–500)
Eosinophils Relative: 2.4 %
HCT: 47.6 % (ref 38.5–50.0)
Hemoglobin: 15.8 g/dL (ref 13.2–17.1)
Lymphs Abs: 2220 cells/uL (ref 850–3900)
MCH: 30.5 pg (ref 27.0–33.0)
MCHC: 33.2 g/dL (ref 32.0–36.0)
MCV: 91.9 fL (ref 80.0–100.0)
MPV: 11.2 fL (ref 7.5–12.5)
Monocytes Relative: 8.1 %
Neutro Abs: 3108 cells/uL (ref 1500–7800)
Neutrophils Relative %: 51.8 %
Platelets: 140 10*3/uL (ref 140–400)
RBC: 5.18 10*6/uL (ref 4.20–5.80)
RDW: 12.7 % (ref 11.0–15.0)
Total Lymphocyte: 37 %
WBC: 6 10*3/uL (ref 3.8–10.8)

## 2020-09-29 LAB — LIPID PANEL
Cholesterol: 186 mg/dL (ref <200)
HDL: 37 mg/dL — ABNORMAL LOW (ref >=40)
LDL Cholesterol (Calc): 122 mg/dL (calc) — ABNORMAL HIGH
Non-HDL Cholesterol (Calc): 149 mg/dL (calc) — ABNORMAL HIGH (ref <130)
Total CHOL/HDL Ratio: 5 (calc) — ABNORMAL HIGH (ref <5.0)
Triglycerides: 158 mg/dL — ABNORMAL HIGH (ref <150)

## 2020-09-29 LAB — HEPATITIS C ANTIBODY
Hepatitis C Ab: NONREACTIVE
SIGNAL TO CUT-OFF: 0 (ref <1.00)

## 2020-10-09 ENCOUNTER — Encounter: Payer: Self-pay | Admitting: Family Medicine

## 2020-10-09 ENCOUNTER — Ambulatory Visit (INDEPENDENT_AMBULATORY_CARE_PROVIDER_SITE_OTHER): Payer: BC Managed Care – PPO | Admitting: Family Medicine

## 2020-10-09 ENCOUNTER — Other Ambulatory Visit: Payer: Self-pay

## 2020-10-09 ENCOUNTER — Telehealth: Payer: Self-pay | Admitting: *Deleted

## 2020-10-09 VITALS — BP 128/70 | HR 78 | Temp 98.4°F | Resp 16 | Ht 76.0 in | Wt 291.0 lb

## 2020-10-09 DIAGNOSIS — Z8739 Personal history of other diseases of the musculoskeletal system and connective tissue: Secondary | ICD-10-CM

## 2020-10-09 DIAGNOSIS — F17200 Nicotine dependence, unspecified, uncomplicated: Secondary | ICD-10-CM | POA: Diagnosis not present

## 2020-10-09 DIAGNOSIS — Z23 Encounter for immunization: Secondary | ICD-10-CM

## 2020-10-09 DIAGNOSIS — Z0001 Encounter for general adult medical examination with abnormal findings: Secondary | ICD-10-CM | POA: Diagnosis not present

## 2020-10-09 DIAGNOSIS — K760 Fatty (change of) liver, not elsewhere classified: Secondary | ICD-10-CM | POA: Diagnosis not present

## 2020-10-09 DIAGNOSIS — E7800 Pure hypercholesterolemia, unspecified: Secondary | ICD-10-CM

## 2020-10-09 DIAGNOSIS — Z0000 Encounter for general adult medical examination without abnormal findings: Secondary | ICD-10-CM

## 2020-10-09 MED ORDER — SILDENAFIL CITRATE 100 MG PO TABS: 50.0000 mg | ORAL_TABLET | Freq: Every day | ORAL | 11 refills | Status: DC | PRN

## 2020-10-09 MED ORDER — ALLOPURINOL 300 MG PO TABS
ORAL_TABLET | ORAL | 3 refills | Status: DC
Start: 2020-10-09 — End: 2021-10-02

## 2020-10-09 MED ORDER — BUPROPION HCL ER (XL) 150 MG PO TB24: 150.0000 mg | ORAL_TABLET | Freq: Every day | ORAL | 5 refills | Status: DC

## 2020-10-09 NOTE — Telephone Encounter (Signed)
Received call from patient.   Reports that he has checked with insurance and Trulicity is not covered.   Ozempic is preferred.   Please advise.

## 2020-10-09 NOTE — Progress Notes (Signed)
Subjective:    Patient ID: Sergio Gonzalez, male    DOB: 01-31-1976, 45 y.o.   MRN: [id number]  Patient is a very pleasant 45 year old Caucasian gentleman here today for a complete physical exam.  He continues to gain weight.  BMI is 35.  He also has fatty liver disease and dyslipidemia.  He has tried to reduce his drinking considerably.  However he is having a difficult time losing weight.  He works as a Psychologist, sport and exercise however he admits that his physical activity level has declined and has become more of a Freight forwarder.  He also states that he is trying to cut back on his diet although he has had a hard time seeing any success with this.  He continues to smoke. Lab on 09/26/2020  Component Date Value Ref Range Status   WBC 09/26/2020 6.0  3.8 - 10.8 Thousand/uL Final   RBC 09/26/2020 5.18  4.20 - 5.80 Million/uL Final   Hemoglobin 09/26/2020 15.8  13.2 - 17.1 g/dL Final   HCT 09/26/2020 47.6  38.5 - 50.0 % Final   MCV 09/26/2020 91.9  80.0 - 100.0 fL Final   MCH 09/26/2020 30.5  27.0 - 33.0 pg Final   MCHC 09/26/2020 33.2  32.0 - 36.0 g/dL Final   RDW 09/26/2020 12.7  11.0 - 15.0 % Final   Platelets 09/26/2020 140  140 - 400 Thousand/uL Final   MPV 09/26/2020 11.2  7.5 - 12.5 fL Final   Neutro Abs 09/26/2020 3,108  1,500 - 7,800 cells/uL Final   Lymphs Abs 09/26/2020 2,220  850 - 3,900 cells/uL Final   Absolute Monocytes 09/26/2020 486  200 - 950 cells/uL Final   Eosinophils Absolute 09/26/2020 144  15 - 500 cells/uL Final   Basophils Absolute 09/26/2020 42  0 - 200 cells/uL Final   Neutrophils Relative % 09/26/2020 51.8  % Final   Total Lymphocyte 09/26/2020 37.0  % Final   Monocytes Relative 09/26/2020 8.1  % Final   Eosinophils Relative 09/26/2020 2.4  % Final   Basophils Relative 09/26/2020 0.7  % Final   Glucose, Bld 09/26/2020 94  65 - 99 mg/dL Final   Comment: .            Fasting reference interval .    BUN 09/26/2020 13  7 - 25 mg/dL Final   Creat 09/26/2020 0.98  0.60 - 1.29 mg/dL Final    eGFR 09/26/2020 98  > OR = 60 mL/min/1.73m Final   Comment: The eGFR is based on the CKD-EPI 2021 equation. To calculate  the new eGFR from a previous Creatinine or Cystatin C result, go to https://www.kidney.org/professionals/ kdoqi/gfr%5Fcalculator    BUN/Creatinine Ratio 089/38/1017NOT APPLICABLE  6 - 22 (calc) Final   Sodium 09/26/2020 139  135 - 146 mmol/L Final   Potassium 09/26/2020 4.2  3.5 - 5.3 mmol/L Final   Chloride 09/26/2020 102  98 - 110 mmol/L Final   CO2 09/26/2020 25  20 - 32 mmol/L Final   Calcium 09/26/2020 9.2  8.6 - 10.3 mg/dL Final   Total Protein 09/26/2020 6.6  6.1 - 8.1 g/dL Final   Albumin 09/26/2020 4.3  3.6 - 5.1 g/dL Final   Globulin 09/26/2020 2.3  1.9 - 3.7 g/dL (calc) Final   AG Ratio 09/26/2020 1.9  1.0 - 2.5 (calc) Final   Total Bilirubin 09/26/2020 0.7  0.2 - 1.2 mg/dL Final   Alkaline phosphatase (APISO) 09/26/2020 88  36 - 130 U/L Final   AST 09/26/2020 32  10 - 40 U/L Final   ALT 09/26/2020 72 (A) 9 - 46 U/L Final   Cholesterol 09/26/2020 186  <200 mg/dL Final   HDL 09/26/2020 37 (A) > OR = 40 mg/dL Final   Triglycerides 09/26/2020 158 (A) <150 mg/dL Final   LDL Cholesterol (Calc) 09/26/2020 122 (A) mg/dL (calc) Final   Comment: Reference range: <100 . Desirable range <100 mg/dL for primary prevention;   <70 mg/dL for patients with CHD or diabetic patients  with > or = 2 CHD risk factors. Marland Kitchen LDL-C is now calculated using the Martin-Hopkins  calculation, which is a validated novel method providing  better accuracy than the Friedewald equation in the  estimation of LDL-C.  Cresenciano Genre et al. Annamaria Helling. 1601;093(23): 2061-2068  (http://education.[facility].com/faq/FAQ164)    Total CHOL/HDL Ratio 09/26/2020 5.0 (A) <5.0 (calc) Final   Non-HDL Cholesterol (Calc) 09/26/2020 149 (A) <130 mg/dL (calc) Final   Comment: For patients with diabetes plus 1 major ASCVD risk  factor, treating to a non-HDL-C goal of <100 mg/dL  (LDL-C of <70 mg/dL) is  considered a therapeutic  option.    Hepatitis C Ab 09/26/2020 NON-REACTIVE  NON-REACTIVE Final   SIGNAL TO CUT-OFF 09/26/2020 0.00  <1.00 Final   Comment: . HCV antibody was non-reactive. There is no laboratory  evidence of HCV infection. . In most cases, no further action is required. However, if recent HCV exposure is suspected, a test for HCV RNA (test code [phone]) is suggested. . For additional information please refer to http://education.questdiagnostics.com/faq/FAQ22v1 (This link is being provided for informational/ educational purposes only.) .    He also reports anxiety on a daily basis.  He reports feeling constantly stressed.  He does not feel that he is had panic attacks but he does feel anxious on a daily basis and also has trouble with mood swings and controlling his temper.  He believes that that is part of the problem with his eating is that he is eating for pleasure in a way to self medicate against feeling anxious and sad.  He denies any depression however or suicidal ideation Past Medical History:  Diagnosis Date   Avascular necrosis of femur head, right (HCC)    GERD (gastroesophageal reflux disease)    H/O hiatal hernia    Hyperlipidemia    Smoker    Past Surgical History:  Procedure Laterality Date   ANTERIOR CERVICAL DECOMP/DISCECTOMY FUSION  08/20/2011   Procedure: ANTERIOR CERVICAL DECOMPRESSION/DISCECTOMY FUSION 2 LEVELS;  Surgeon: Faythe Ghee, MD;  Location: [facility];  Service: Neurosurgery;  Laterality: Bilateral;  Cervical five-six, six-seven anterior cervical discectomy with fusion   EYE SURGERY     lasic     SPINE SURGERY     TONSILLECTOMY     Current Outpatient Medications on File Prior to Visit  Medication Sig Dispense Refill   colchicine 0.6 MG tablet TAKE 2 CAPSULES BY MOUTH AT FIRST SIGN OF GOUT THEN 1 CAPSULE BY MOUTH IN 1 HOURS IF PAIN CONTINUES 60 tablet 1   omeprazole (PRILOSEC) 40 MG capsule TAKE 1 CAPSULE(40 MG) BY MOUTH DAILY  30 capsule 0   pravastatin (PRAVACHOL) 20 MG tablet TAKE 1 TABLET(20 MG) BY MOUTH DAILY 30 tablet 0   No current facility-administered medications on file prior to visit.   No Known Allergies Social History   Socioeconomic History   Marital status: Married    Spouse name: Not on file   Number of children: Not on file   Years of education: 47yr  [employer]   Highest education level: Not on file  Occupational History   Occupation: farm work   Tobacco Use   Smoking status: Some Days    Packs/day: 0.25    Types: Cigarettes   Smokeless tobacco: Never  Substance and Sexual Activity   Alcohol use: Yes    Alcohol/week: 12.0 standard drinks    Types: 12 Cans of beer per week   Drug use: No   Sexual activity: Yes  Other Topics Concern   Not on file  Social History Narrative   Not on file   Social Determinants of Health   Financial Resource Strain: Not on file  Food Insecurity: Not on file  Transportation Needs: Not on file  Physical Activity: Not on file  Stress: Not on file  Social Connections: Not on file  Intimate Partner Violence: Not on file    Family History  Problem Relation Age of Onset   Cancer Other        family history    Diabetes Other        family history    Cancer Mother    Cancer Maternal Grandfather    Cancer Paternal Grandfather      Review of Systems  All other systems reviewed and are negative.     Objective:   Physical Exam Vitals reviewed.  Constitutional:      General: He is not in acute distress.    Appearance: Normal appearance. He is well-developed. He is obese. He is not ill-appearing, toxic-appearing or diaphoretic.  HENT:     Head: Normocephalic and atraumatic.     Right Ear: External ear normal.     Left Ear: External ear normal.     Nose: Nose normal.     Mouth/Throat:     Mouth: Mucous membranes are moist.     Pharynx: Oropharynx is clear. No oropharyngeal exudate or posterior oropharyngeal erythema.  Eyes:     Extraocular  Movements: Extraocular movements intact.     Conjunctiva/sclera: Conjunctivae normal.     Pupils: Pupils are equal, round, and reactive to light.  Cardiovascular:     Rate and Rhythm: Normal rate and regular rhythm.     Heart sounds: Normal heart sounds. No murmur heard.   No friction rub. No gallop.  Pulmonary:     Effort: Pulmonary effort is normal. No respiratory distress.     Breath sounds: Normal breath sounds. No stridor. No wheezing, rhonchi or rales.  Chest:     Chest wall: No tenderness.  Abdominal:     General: Bowel sounds are normal. There is no distension.     Palpations: Abdomen is soft. There is no mass.     Tenderness: There is no abdominal tenderness. There is no guarding or rebound.     Hernia: No hernia is present.  Musculoskeletal:     Right lower leg: No edema.     Left lower leg: No edema.  Skin:    General: Skin is warm.     Coloration: Skin is not jaundiced or pale.     Findings: No bruising, erythema, lesion or rash.  Neurological:     General: No focal deficit present.     Mental Status: He is alert and oriented to person, place, and time. Mental status is at baseline.     Cranial Nerves: No cranial nerve deficit.     Sensory: No sensory deficit.     Motor: No weakness.     Coordination: Coordination normal.  Gait: Gait normal.  Psychiatric:        Mood and Affect: Mood normal.        Behavior: Behavior normal.        Thought Content: Thought content normal.        Judgment: Judgment normal.          Assessment & Plan:  Need for immunization against influenza - Plan: Flu Vaccine QUAD 84moIM (Fluarix, Fluzone & Alfiuria Quad PF)  General medical exam  Fatty liver disease, nonalcoholic  History of gout  Smoker  Pure hypercholesterolemia Biggest issue for this patient is weight loss and smoking cessation.  I will start him on Wellbutrin XL 150 mg p.o. every morning for possible anxiety.  We will reassess in 6 weeks to see if this is  helping.  Also discussed with him Trulicity for weight loss and he will check on the price of this.  Patient received his flu shot today and I did recommend a booster for his COVID shot.  I believe we were able to lose 20 to 30 pounds, his fatty liver disease and his dyslipidemia would improve.  Therefore he is going to work on seeing if the Trulicity is covered and also try treating his stress and anxiety.  Recommended smoking cessation

## 2020-10-10 MED ORDER — OZEMPIC (0.25 OR 0.5 MG/DOSE) 2 MG/1.5ML ~~LOC~~ SOPN: PEN_INJECTOR | SUBCUTANEOUS | 0 refills | Status: DC

## 2020-10-10 NOTE — Telephone Encounter (Signed)
Patient returned call and made aware.

## 2020-10-10 NOTE — Telephone Encounter (Signed)
Prescription sent to pharmacy.   Call placed to patient. LMTRC.  

## 2020-10-15 ENCOUNTER — Telehealth: Payer: Self-pay | Admitting: *Deleted

## 2020-10-15 NOTE — Telephone Encounter (Signed)
Received request from pharmacy for PA on Ozempic.   PA submitted.   Dx: R73.09- other abnormal glucose   [facility] has not yet replied to your PA request. You may close this dialog, return to your dashboard, and perform other tasks.  To check for an update later, open this request again from your dashboard.  If [facility] has not replied to your request within 24 hours please contact [facility] at [phone].

## 2020-10-16 NOTE — Telephone Encounter (Signed)
Received PA determination.   PA denied.   This medication may be approved for those who are using the requested drug for the treatment of DM (A1C >6.5%).  Please advise.

## 2020-10-17 MED ORDER — SAXENDA 18 MG/3ML ~~LOC~~ SOPN: PEN_INJECTOR | SUBCUTANEOUS | 3 refills | Status: DC

## 2020-10-17 NOTE — Addendum Note (Signed)
Addended by: Phillips Odor on: 10/17/2020 09:03 AM   Modules accepted: Orders

## 2020-10-17 NOTE — Telephone Encounter (Signed)
Call placed to patient and patient made aware.   Patient agreeable to trying daily Saxenda until [place] is available.   Prescription sent to pharmacy.

## 2020-10-27 ENCOUNTER — Other Ambulatory Visit: Payer: Self-pay | Admitting: Family Medicine

## 2020-11-26 ENCOUNTER — Other Ambulatory Visit: Payer: Self-pay | Admitting: Family Medicine

## 2020-12-08 ENCOUNTER — Emergency Department (HOSPITAL_COMMUNITY): Payer: BC Managed Care – PPO

## 2020-12-08 ENCOUNTER — Encounter (HOSPITAL_COMMUNITY): Payer: Self-pay | Admitting: *Deleted

## 2020-12-08 ENCOUNTER — Other Ambulatory Visit: Payer: Self-pay

## 2020-12-08 ENCOUNTER — Emergency Department (HOSPITAL_COMMUNITY)
Admission: EM | Admit: 2020-12-08 | Discharge: 2020-12-08 | Disposition: A | Discharge status: Home / Self Care | Payer: BC Managed Care – PPO | Attending: Emergency Medicine | Admitting: Emergency Medicine

## 2020-12-08 DIAGNOSIS — S161XXA Strain of muscle, fascia and tendon at neck level, initial encounter: Secondary | ICD-10-CM | POA: Insufficient documentation

## 2020-12-08 DIAGNOSIS — S39012A Strain of muscle, fascia and tendon of lower back, initial encounter: Secondary | ICD-10-CM | POA: Insufficient documentation

## 2020-12-08 DIAGNOSIS — R42 Dizziness and giddiness: Secondary | ICD-10-CM | POA: Insufficient documentation

## 2020-12-08 DIAGNOSIS — F17210 Nicotine dependence, cigarettes, uncomplicated: Secondary | ICD-10-CM | POA: Insufficient documentation

## 2020-12-08 DIAGNOSIS — Z8616 Personal history of COVID-19: Secondary | ICD-10-CM | POA: Diagnosis not present

## 2020-12-08 DIAGNOSIS — Y92410 Unspecified street and highway as the place of occurrence of the external cause: Secondary | ICD-10-CM | POA: Diagnosis not present

## 2020-12-08 DIAGNOSIS — M545 Low back pain, unspecified: Secondary | ICD-10-CM | POA: Diagnosis not present

## 2020-12-08 DIAGNOSIS — S199XXA Unspecified injury of neck, initial encounter: Secondary | ICD-10-CM | POA: Diagnosis not present

## 2020-12-08 DIAGNOSIS — M542 Cervicalgia: Secondary | ICD-10-CM | POA: Diagnosis not present

## 2020-12-08 MED ORDER — METHOCARBAMOL 500 MG PO TABS
500.0000 mg | ORAL_TABLET | Freq: Once | ORAL | Status: AC
  Administered 2020-12-08: 500 mg via ORAL
  Filled 2020-12-08: qty 1

## 2020-12-08 MED ORDER — IBUPROFEN 400 MG PO TABS
400.0000 mg | ORAL_TABLET | Freq: Once | ORAL | Status: AC
  Administered 2020-12-08: 400 mg via ORAL
  Filled 2020-12-08: qty 1

## 2020-12-08 MED ORDER — METHOCARBAMOL 500 MG PO TABS: 500.0000 mg | ORAL_TABLET | Freq: Three times a day (TID) | ORAL | 0 refills | Status: DC

## 2020-12-08 NOTE — ED Notes (Signed)
Pt A&Ox4 ambulatory at d/c with independent steady gait, NAD. Pt verbalized understanding of d/c instructions, prescription and follow up care. 

## 2020-12-08 NOTE — Discharge Instructions (Signed)
Your x-rays today were reassuring.  Your injuries are felt to be musculoskeletal.  Recommend that you alternate ice on and off to the affected areas.  Avoid bending or heavy lifting for several days.  Take over-the-counter ibuprofen, 800 mg 3 times a day with food as needed for pain.  Also been prescribed a muscle relaxer that may help with your symptoms.  Please follow-up with your primary care provider in 1 week if your symptoms or not improving.  Return emergency department for any new or worsening symptoms.

## 2020-12-08 NOTE — ED Notes (Signed)
Pt has returned to room.
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
## 2020-12-08 NOTE — ED Notes (Signed)
Patient transported to X-ray via wheelchair 

## 2020-12-08 NOTE — ED Notes (Signed)
PA at bedside.
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
## 2020-12-16 ENCOUNTER — Other Ambulatory Visit: Payer: Self-pay

## 2020-12-16 ENCOUNTER — Ambulatory Visit: Payer: BC Managed Care – PPO | Admitting: Family Medicine

## 2020-12-16 ENCOUNTER — Encounter: Payer: Self-pay | Admitting: Family Medicine

## 2020-12-16 VITALS — BP 132/68 | HR 81 | Temp 98.0°F | Resp 18 | Ht 76.0 in | Wt 294.0 lb

## 2020-12-16 DIAGNOSIS — M62830 Muscle spasm of back: Secondary | ICD-10-CM

## 2020-12-16 MED ORDER — PREDNISONE 20 MG PO TABS: ORAL_TABLET | ORAL | 0 refills | Status: DC

## 2020-12-16 MED ORDER — CARISOPRODOL 350 MG PO TABS: 350.0000 mg | ORAL_TABLET | Freq: Four times a day (QID) | ORAL | 0 refills | Status: DC | PRN
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
## 2020-12-22 ENCOUNTER — Telehealth: Payer: Self-pay | Admitting: Family Medicine

## 2020-12-22 DIAGNOSIS — M62830 Muscle spasm of back: Secondary | ICD-10-CM

## 2020-12-22 NOTE — Telephone Encounter (Signed)
Referral placed for PT. Pt aware. Nothing further needed.

## 2020-12-22 NOTE — Telephone Encounter (Signed)
Per LOV 12/16/20: Consider physical therapy referral if severe pain persist.  Please advise on pt's request for PT orders. Thanks!

## 2020-12-22 NOTE — Telephone Encounter (Signed)
Patient called to follow up on visit last week; prescribed prednisone and muscle relaxer and instructed by provider to call for referral to rehab if symptoms continue. Patient still experiencing constant burning and shooting pain whenever he moves.  Please advise at [phone].

## 2020-12-22 NOTE — Telephone Encounter (Signed)
Please place referral to PT

## 2020-12-24 ENCOUNTER — Ambulatory Visit (HOSPITAL_COMMUNITY): Payer: BC Managed Care – PPO | Attending: Nurse Practitioner | Admitting: Physical Therapy

## 2020-12-24 ENCOUNTER — Other Ambulatory Visit: Payer: Self-pay

## 2020-12-24 DIAGNOSIS — M62830 Muscle spasm of back: Secondary | ICD-10-CM | POA: Insufficient documentation

## 2020-12-24 DIAGNOSIS — M5416 Radiculopathy, lumbar region: Secondary | ICD-10-CM | POA: Insufficient documentation
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
## 2020-12-26 ENCOUNTER — Other Ambulatory Visit: Payer: Self-pay | Admitting: Family Medicine

## 2020-12-29 ENCOUNTER — Encounter (HOSPITAL_COMMUNITY): Payer: BC Managed Care – PPO | Admitting: Physical Therapy

## 2020-12-30 ENCOUNTER — Telehealth (HOSPITAL_COMMUNITY): Payer: Self-pay | Admitting: Physical Therapy

## 2020-12-30 NOTE — Telephone Encounter (Signed)
Patient called l/m to cx 12/31/2020 due to he still has the flu and will not be here for his 8:30am

## 2020-12-31 ENCOUNTER — Ambulatory Visit (HOSPITAL_COMMUNITY): Payer: BC Managed Care – PPO | Admitting: Physical Therapy

## 2021-01-05 ENCOUNTER — Ambulatory Visit (HOSPITAL_COMMUNITY): Payer: BC Managed Care – PPO

## 2021-01-07 ENCOUNTER — Ambulatory Visit (HOSPITAL_COMMUNITY): Payer: BC Managed Care – PPO | Admitting: Physical Therapy

## 2021-01-12 ENCOUNTER — Telehealth (HOSPITAL_COMMUNITY): Payer: Self-pay | Admitting: Physical Therapy

## 2021-01-12 ENCOUNTER — Ambulatory Visit (HOSPITAL_COMMUNITY): Payer: BC Managed Care – PPO | Attending: Nurse Practitioner | Admitting: Physical Therapy

## 2021-01-12 NOTE — Telephone Encounter (Signed)
PT states [place] and will not be home for the rest of the week.  Virgina Organ, PT CLT [phone]

## 2021-01-14 ENCOUNTER — Ambulatory Visit (HOSPITAL_COMMUNITY): Payer: BC Managed Care – PPO | Admitting: Physical Therapy

## 2021-01-20 ENCOUNTER — Ambulatory Visit (HOSPITAL_COMMUNITY): Payer: BC Managed Care – PPO | Admitting: Physical Therapy

## 2021-01-22 ENCOUNTER — Encounter (HOSPITAL_COMMUNITY): Payer: BC Managed Care – PPO | Admitting: Physical Therapy

## 2021-01-25 ENCOUNTER — Other Ambulatory Visit: Payer: Self-pay | Admitting: Family Medicine

## 2021-01-28 ENCOUNTER — Telehealth: Payer: Self-pay

## 2021-01-28 ENCOUNTER — Encounter (HOSPITAL_COMMUNITY): Payer: BC Managed Care – PPO | Admitting: Physical Therapy

## 2021-01-28 NOTE — Telephone Encounter (Signed)
Pt called to report he started having head congestion yesterday and tested POSITIVE for COVID today. Pt would like to know if he could take one of the antiviral rxs. Pt has taken Zicam but nothing else OTC. Pt uses [facility] in chart.   Please advise, thanks!

## 2021-01-29 ENCOUNTER — Other Ambulatory Visit: Payer: Self-pay | Admitting: Family Medicine

## 2021-01-29 MED ORDER — NIRMATRELVIR/RITONAVIR (PAXLOVID)TABLET: 3.0000 | ORAL_TABLET | Freq: Two times a day (BID) | ORAL | 0 refills | Status: AC

## 2021-01-29 NOTE — Telephone Encounter (Signed)
Pt advised of rx. Nothing further needed at this time.  ° °

## 2021-01-30 ENCOUNTER — Encounter (HOSPITAL_COMMUNITY): Payer: BC Managed Care – PPO

## 2021-02-03 ENCOUNTER — Encounter (HOSPITAL_COMMUNITY): Payer: BC Managed Care – PPO | Admitting: Physical Therapy

## 2021-02-05 ENCOUNTER — Encounter (HOSPITAL_COMMUNITY): Payer: BC Managed Care – PPO | Admitting: Physical Therapy

## 2021-02-20 ENCOUNTER — Encounter (HOSPITAL_COMMUNITY): Payer: Self-pay | Admitting: Physical Therapy

## 2021-02-20 NOTE — Therapy (Signed)
[place], [place], [address] Phone: [phone]   Fax:  [phone]  Patient Details  Name: Sergio Gonzalez MRN: [id number] Date of Birth: 11-May-1975 Referring Provider:  No ref. provider found  Encounter Date: 02/20/2021   PHYSICAL THERAPY DISCHARGE SUMMARY  Visits from Start of Care: 1  Current functional level related to goals / functional outcomes: Unknown pt did not return    Remaining deficits: Unknown pt did not return    Education / Equipment: HEP   Patient agrees to discharge. Patient goals were not met. Patient is being discharged due to not returning since the last visit.   Rayetta Humphrey, PT CLT [phone]  02/20/2021, 2:20 PM  [facility] [address] [place], [place], [address] Phone: [phone]   Fax:  [phone]

## 2021-04-13 ENCOUNTER — Telehealth: Payer: BC Managed Care – PPO | Admitting: Physician Assistant

## 2021-04-13 DIAGNOSIS — M549 Dorsalgia, unspecified: Secondary | ICD-10-CM

## 2021-04-13 NOTE — Progress Notes (Signed)
Based on what you shared with me, I feel your condition warrants further evaluation and I recommend that you be seen in a face to face visit. ?  ?Giving 10/10 back pain, radiation into groin or abdomen and weakness of extremities, you must be evaluated in person as you need a detailed examination to make sure imaging or further evaluation is not needed and so that proper treatment can be given. Please do not delay care! ? ?NOTE: There will be NO CHARGE for this eVisit ?  ?If you are having a true medical emergency please call 911.   ?  ? For an urgent face to face visit, [facility] has six urgent care centers for your convenience:  ?  ? [facility] at [facility] ?Get Driving Directions ?[phone] ?[id number] ?[place], [place] [address] ?  ? [facility]) ?Get Driving Directions ?[phone] ?[address] ?[place], [place] [address] ? ?[facility]) ?Get Driving Directions ?[phone] ?[address] ?[place],  [place]  [address] ? ?[facility] ?Get Driving Directions ?[phone] ?[address] ?[place], [place] [address] ?  ?[facility] [place] ?Get Driving Directions  ?[phone] ?[address] ?[address] ?[place], [place] [address] ?  ? Urgent Care at [facility] ?Get Driving Directions ?[phone] ?[address] ?[place], [place] [address] ? ?Your MyChart E-visit questionnaire answers were reviewed by a board certified advanced clinical practitioner to complete your personal care plan based on your specific symptoms.  Thank you for using e-Visits. ?  ? ?

## 2021-04-14 ENCOUNTER — Encounter: Payer: Self-pay | Admitting: Family Medicine

## 2021-04-14 ENCOUNTER — Ambulatory Visit: Payer: BC Managed Care – PPO | Admitting: Family Medicine

## 2021-04-14 ENCOUNTER — Other Ambulatory Visit: Payer: Self-pay

## 2021-04-14 VITALS — BP 132/78 | HR 89 | Temp 97.2°F | Resp 18 | Ht 76.0 in | Wt 298.0 lb

## 2021-04-14 DIAGNOSIS — M5126 Other intervertebral disc displacement, lumbar region: Secondary | ICD-10-CM

## 2021-04-14 MED ORDER — PREDNISONE 20 MG PO TABS: ORAL_TABLET | ORAL | 0 refills | Status: DC

## 2021-04-14 MED ORDER — OXYCODONE-ACETAMINOPHEN 7.5-325 MG PO TABS: 1.0000 | ORAL_TABLET | ORAL | 0 refills | Status: DC | PRN

## 2021-04-14 NOTE — Progress Notes (Signed)
? ?Subjective:  ? ? Patient ID: Sergio Gonzalez, male    DOB: 11-10-1975, 46 y.o.   MRN: [id number] ? ?Back Pain ?Today, the patient was standing up when he felt a pop in the center of his lower back roughly around the level of L5 of.  He felt a sudden severe pain in the center of his back.  If he is laying down, he has no pain.  However if he tries to stand up, he has severe pain in the center of his lower back that radiates into both legs.  Once he is standing, he denies any weakness in his legs or numbness in his legs although he does have radiating pain into his anterior thighs bilaterally.  He denies any saddle anesthesia or bowel or bladder incontinence.  He has palpable muscle spasms on either side of the lumbar spine ?Past Medical History:  ?Diagnosis Date  ? Avascular necrosis of femur head, right (HCC)   ? GERD (gastroesophageal reflux disease)   ? H/O hiatal hernia   ? Hyperlipidemia   ? Smoker   ? ?Past Surgical History:  ?Procedure Laterality Date  ? ANTERIOR CERVICAL DECOMP/DISCECTOMY FUSION  08/20/2011  ? Procedure: ANTERIOR CERVICAL DECOMPRESSION/DISCECTOMY FUSION 2 LEVELS;  Surgeon: [name], MD;  Location: [facility];  Service: Neurosurgery;  Laterality: Bilateral;  Cervical five-six, six-seven anterior cervical discectomy with fusion  ? EYE SURGERY    ? lasic    ? SPINE SURGERY    ? TONSILLECTOMY    ? ?Current Outpatient Medications on File Prior to Visit  ?Medication Sig Dispense Refill  ? allopurinol (ZYLOPRIM) 300 MG tablet TAKE 1 TABLET(300 MG) BY MOUTH DAILY 90 tablet 3  ? buPROPion (WELLBUTRIN XL) 150 MG 24 hr tablet Take 1 tablet (150 mg total) by mouth daily. 30 tablet 5  ? carisoprodol (SOMA) 350 MG tablet Take 1 tablet (350 mg total) by mouth 4 (four) times daily as needed for muscle spasms. 30 tablet 0  ? colchicine 0.6 MG tablet TAKE 2 CAPSULES BY MOUTH AT FIRST SIGN OF GOUT THEN 1 CAPSULE BY MOUTH IN 1 HOURS IF PAIN CONTINUES 60 tablet 1  ? omeprazole (PRILOSEC) 40 MG capsule TAKE  1 CAPSULE(40 MG) BY MOUTH DAILY 90 capsule 3  ? pravastatin (PRAVACHOL) 20 MG tablet TAKE 1 TABLET(20 MG) BY MOUTH DAILY 90 tablet 3  ? sildenafil (VIAGRA) 100 MG tablet Take 0.5-1 tablets (50-100 mg total) by mouth daily as needed for erectile dysfunction. 5 tablet 11  ? ?No current facility-administered medications on file prior to visit.  ? ?No Known Allergies ?Social History  ? ?Socioeconomic History  ? Marital status: Married  ?  Spouse name: Not on file  ? Number of children: Not on file  ? Years of education: 72yr [employer]  ? Highest education level: Not on file  ?Occupational History  ? Occupation: farm work   ?Tobacco Use  ? Smoking status: Some Days  ?  Types: Cigarettes  ? Smokeless tobacco: Never  ? Tobacco comments:  ?  1 pack of cigarettes every other week   ?Vaping Use  ? Vaping Use: Never used  ?Substance and Sexual Activity  ? Alcohol use: Not Currently  ?  Comment: twice weekly, usually on weekends  ? Drug use: No  ? Sexual activity: Yes  ?Other Topics Concern  ? Not on file  ?Social History Narrative  ? Not on file  ? ?Social Determinants of Health  ? ?Financial Resource Strain: Not  on file  ?Food Insecurity: Not on file  ?Transportation Needs: Not on file  ?Physical Activity: Not on file  ?Stress: Not on file  ?Social Connections: Not on file  ?Intimate Partner Violence: Not on file  ? ? ? ?Review of Systems  ?Musculoskeletal:  Positive for back pain.  ? ?   ?Objective:  ? Physical Exam ?Constitutional:   ?   Appearance: Normal appearance. He is obese. He is not ill-appearing or toxic-appearing.  ?Cardiovascular:  ?   Rate and Rhythm: Normal rate and regular rhythm.  ?   Pulses: Normal pulses.  ?   Heart sounds: Normal heart sounds.  ?Pulmonary:  ?   Effort: Pulmonary effort is normal.  ?   Breath sounds: Normal breath sounds.  ?Musculoskeletal:  ?   Lumbar back: Spasms, tenderness and bony tenderness present. No swelling, deformity, signs of trauma or lacerations. Decreased range of motion. Negative  right straight leg raise test and negative left straight leg raise test.  ?     Back: ? ?Neurological:  ?   Mental Status: He is alert.  ?   Sensory: No sensory deficit.  ?   Motor: No weakness.  ?   Deep Tendon Reflexes: Reflexes normal.  ? ? ? ? ? ?   ?Assessment & Plan:  ?Lumbar herniated disc ?I believe the patient likely herniated a disc in his back on Friday.  Begin a prednisone taper pack.  Use Percocet 7.5/325 1 p.o. every 6 hours as needed pain.  Recheck next week if no better or sooner if worse. ? ?

## 2021-04-16 ENCOUNTER — Ambulatory Visit: Payer: BC Managed Care – PPO | Admitting: Family Medicine

## 2021-06-15 ENCOUNTER — Other Ambulatory Visit: Payer: Self-pay | Admitting: Family Medicine

## 2021-06-16 NOTE — Telephone Encounter (Signed)
Requested Prescriptions  ?Pending Prescriptions Disp Refills  ?? colchicine 0.6 MG tablet [Pharmacy Med Name: COLCHICINE 0.6MG  TABLETS] 60 tablet 1  ?  Sig: TAKE 2 TABLETS BY MOUTH AT FIRST SIGN OF GOUT, THEN 1 TABLET IN 1 HOURS IF PAIN CONTINUES  ?  ? Endocrinology:  Gout Agents - colchicine Failed - 06/15/2021 11:28 AM  ?  ?  Failed - ALT in normal range and within 360 days  ?  ALT  ?Date Value Ref Range Status  ?09/26/2020 72 (H) 9 - 46 U/L Final  ?   ?  ?  Passed - Cr in normal range and within 360 days  ?  Creat  ?Date Value Ref Range Status  ?09/26/2020 0.98 0.60 - 1.29 mg/dL Final  ?   ?  ?  Passed - AST in normal range and within 360 days  ?  AST  ?Date Value Ref Range Status  ?09/26/2020 32 10 - 40 U/L Final  ?   ?  ?  Passed - Valid encounter within last 12 months  ?  Recent Outpatient Visits   ?      ? 2 months ago Lumbar herniated disc  ? [facility] [place], Priscille Heidelberg, MD  ? 6 months ago Spasm of muscle of lower back  ? [facility] Pickard, Priscille Heidelberg, MD  ? 8 months ago Need for immunization against influenza  ? [facility] Pickard, Priscille Heidelberg, MD  ? 1 year ago Dyspnea on exertion  ? [facility] [place], Priscille Heidelberg, MD  ? 2 years ago Fatty liver disease, nonalcoholic  ? [facility] Pickard, Priscille Heidelberg, MD  ?  ?  ? ?  ?  ?  Passed - CBC within normal limits and completed in the last 12 months  ?  WBC  ?Date Value Ref Range Status  ?09/26/2020 6.0 3.8 - 10.8 Thousand/uL Final  ? ?RBC  ?Date Value Ref Range Status  ?09/26/2020 5.18 4.20 - 5.80 Million/uL Final  ? ?Hemoglobin  ?Date Value Ref Range Status  ?09/26/2020 15.8 13.2 - 17.1 g/dL Final  ? ?HCT  ?Date Value Ref Range Status  ?09/26/2020 47.6 38.5 - 50.0 % Final  ? ?MCHC  ?Date Value Ref Range Status  ?09/26/2020 33.2 32.0 - 36.0 g/dL Final  ? ?MCH  ?Date Value Ref Range Status  ?09/26/2020 30.5 27.0 - 33.0 pg Final  ? ?MCV  ?Date Value Ref Range Status  ?09/26/2020 91.9  80.0 - 100.0 fL Final  ? ?No results found for: PLTCOUNTKUC, LABPLAT, POCPLA ?RDW  ?Date Value Ref Range Status  ?09/26/2020 12.7 11.0 - 15.0 % Final  ? ?  ?  ?  ? ? ?

## 2021-10-01 ENCOUNTER — Other Ambulatory Visit: Payer: Self-pay | Admitting: Family Medicine

## 2021-10-02 NOTE — Telephone Encounter (Signed)
Requested medication (s) are due for refill today - yes  Requested medication (s) are on the active medication list -yes  Future visit scheduled -no  Last refill: 10/09/20 #90 3RF  Notes to clinic: fails lab protocol- over 1 year-09/26/2020  Requested Prescriptions  Pending Prescriptions Disp Refills   allopurinol (ZYLOPRIM) 300 MG tablet [Pharmacy Med Name: ALLOPURINOL 300MG  TABLETS] 90 tablet 3    Sig: TAKE 1 TABLET(300 MG) BY MOUTH DAILY     Endocrinology:  Gout Agents - allopurinol Failed - 10/01/2021 11:54 AM      Failed - Uric Acid in normal range and within 360 days    Uric Acid, Serum  Date Value Ref Range Status  09/21/2018 9.9 (H) 4.0 - 8.0 mg/dL Final    Comment:    Therapeutic target for gout patients: <6.0 mg/dL .          Failed - Cr in normal range and within 360 days    Creat  Date Value Ref Range Status  09/26/2020 0.98 0.60 - 1.29 mg/dL Final         Failed - CBC within normal limits and completed in the last 12 months    WBC  Date Value Ref Range Status  09/26/2020 6.0 3.8 - 10.8 Thousand/uL Final   RBC  Date Value Ref Range Status  09/26/2020 5.18 4.20 - 5.80 Million/uL Final   Hemoglobin  Date Value Ref Range Status  09/26/2020 15.8 13.2 - 17.1 g/dL Final   HCT  Date Value Ref Range Status  09/26/2020 47.6 38.5 - 50.0 % Final   MCHC  Date Value Ref Range Status  09/26/2020 33.2 32.0 - 36.0 g/dL Final   [facility]  Date Value Ref Range Status  09/26/2020 30.5 27.0 - 33.0 pg Final   MCV  Date Value Ref Range Status  09/26/2020 91.9 80.0 - 100.0 fL Final   No results found for: "PLTCOUNTKUC", "LABPLAT", "POCPLA" RDW  Date Value Ref Range Status  09/26/2020 12.7 11.0 - 15.0 % Final         Passed - Valid encounter within last 12 months    Recent Outpatient Visits           5 months ago Lumbar herniated disc   [facility]   9 months ago Spasm of muscle of lower back   [facility]  Pickard, [facility], MD   11 months ago Need for immunization against influenza   [facility], MD   2 years ago Dyspnea on exertion   [facility], MD   3 years ago Fatty liver disease, nonalcoholic   [facility] Pickard, [facility], MD                 Requested Prescriptions  Pending Prescriptions Disp Refills   allopurinol (ZYLOPRIM) 300 MG tablet [Pharmacy Med Name: ALLOPURINOL 300MG  TABLETS] 90 tablet 3    Sig: TAKE 1 TABLET(300 MG) BY MOUTH DAILY     Endocrinology:  Gout Agents - allopurinol Failed - 10/01/2021 11:54 AM      Failed - Uric Acid in normal range and within 360 days    Uric Acid, Serum  Date Value Ref Range Status  09/21/2018 9.9 (H) 4.0 - 8.0 mg/dL Final    Comment:    Therapeutic target for gout patients: <6.0 mg/dL .          Failed -  Cr in normal range and within 360 days    Creat  Date Value Ref Range Status  09/26/2020 0.98 0.60 - 1.29 mg/dL Final         Failed - CBC within normal limits and completed in the last 12 months    WBC  Date Value Ref Range Status  09/26/2020 6.0 3.8 - 10.8 Thousand/uL Final   RBC  Date Value Ref Range Status  09/26/2020 5.18 4.20 - 5.80 Million/uL Final   Hemoglobin  Date Value Ref Range Status  09/26/2020 15.8 13.2 - 17.1 g/dL Final   HCT  Date Value Ref Range Status  09/26/2020 47.6 38.5 - 50.0 % Final   MCHC  Date Value Ref Range Status  09/26/2020 33.2 32.0 - 36.0 g/dL Final   [facility]  Date Value Ref Range Status  09/26/2020 30.5 27.0 - 33.0 pg Final   MCV  Date Value Ref Range Status  09/26/2020 91.9 80.0 - 100.0 fL Final   No results found for: "PLTCOUNTKUC", "LABPLAT", "POCPLA" RDW  Date Value Ref Range Status  09/26/2020 12.7 11.0 - 15.0 % Final         Passed - Valid encounter within last 12 months    Recent Outpatient Visits           5 months ago Lumbar herniated disc   [facility] [name], MD   9 months ago Spasm of muscle of lower back   [facility] Tanya Nones, Priscille Heidelberg, MD   11 months ago Need for immunization against influenza   [facility] Pickard, Priscille Heidelberg, MD   2 years ago Dyspnea on exertion   [facility] [name], MD   3 years ago Fatty liver disease, nonalcoholic   [facility] Pickard, Priscille Heidelberg, MD

## 2022-01-04 ENCOUNTER — Other Ambulatory Visit: Payer: Self-pay | Admitting: Family Medicine

## 2022-03-08 ENCOUNTER — Encounter: Payer: Self-pay | Admitting: Family Medicine

## 2022-03-08 ENCOUNTER — Ambulatory Visit (INDEPENDENT_AMBULATORY_CARE_PROVIDER_SITE_OTHER): Payer: BC Managed Care – PPO | Admitting: Family Medicine

## 2022-03-08 VITALS — BP 136/80 | HR 89 | Temp 98.2°F | Ht 76.0 in | Wt 296.6 lb

## 2022-03-08 DIAGNOSIS — Z23 Encounter for immunization: Secondary | ICD-10-CM

## 2022-03-08 DIAGNOSIS — E785 Hyperlipidemia, unspecified: Secondary | ICD-10-CM | POA: Diagnosis not present

## 2022-03-08 DIAGNOSIS — Z1211 Encounter for screening for malignant neoplasm of colon: Secondary | ICD-10-CM

## 2022-03-08 DIAGNOSIS — Z0001 Encounter for general adult medical examination with abnormal findings: Secondary | ICD-10-CM

## 2022-03-08 DIAGNOSIS — Z0000 Encounter for general adult medical examination without abnormal findings: Secondary | ICD-10-CM

## 2022-03-08 NOTE — Progress Notes (Signed)
Subjective:    Patient ID: Sergio Gonzalez, male    DOB: 20-Dec-1975, 47 y.o.   MRN: [id number]  Patient is a very pleasant 47 year old Caucasian gentleman here today for a complete physical exam.  He continues to gain weight.  BMI is 35.  He also has fatty liver disease and dyslipidemia.  He continues to drink on the weekends.  He also smokes on weekends.  However he is having a difficult time losing weight.  He works as a Psychologist, sport and exercise however he admits that his physical activity level has declined and has become more of a Freight forwarder.  He does drink a lot of Gatorade.  He is due for colon cancer screening.  He is due for a flu shot, COVID shot, and a tetanus shot.  He denies any chest pain, shortness of breath, dyspnea on exertion Past Medical History:  Diagnosis Date   Avascular necrosis of femur head, right (HCC)    GERD (gastroesophageal reflux disease)    H/O hiatal hernia    Hyperlipidemia    Smoker    Past Surgical History:  Procedure Laterality Date   ANTERIOR CERVICAL DECOMP/DISCECTOMY FUSION  08/20/2011   Procedure: ANTERIOR CERVICAL DECOMPRESSION/DISCECTOMY FUSION 2 LEVELS;  Surgeon: Faythe Ghee, MD;  Location: [facility];  Service: Neurosurgery;  Laterality: Bilateral;  Cervical five-six, six-seven anterior cervical discectomy with fusion   EYE SURGERY     lasic     SPINE SURGERY     TONSILLECTOMY     Current Outpatient Medications on File Prior to Visit  Medication Sig Dispense Refill   allopurinol (ZYLOPRIM) 300 MG tablet TAKE 1 TABLET(300 MG) BY MOUTH DAILY 90 tablet 3   colchicine 0.6 MG tablet TAKE 2 TABLETS BY MOUTH AT FIRST SIGN OF GOUT, THEN 1 TABLET IN 1 HOURS IF PAIN CONTINUES 90 tablet 2   omeprazole (PRILOSEC) 40 MG capsule TAKE 1 CAPSULE(40 MG) BY MOUTH DAILY 90 capsule 3   pravastatin (PRAVACHOL) 20 MG tablet TAKE 1 TABLET(20 MG) BY MOUTH DAILY 90 tablet 3   sildenafil (VIAGRA) 100 MG tablet Take 0.5-1 tablets (50-100 mg total) by mouth daily as needed for erectile  dysfunction. 5 tablet 11   No current facility-administered medications on file prior to visit.   No Known Allergies Social History   Socioeconomic History   Marital status: Married    Spouse name: Not on file   Number of children: Not on file   Years of education: 15yr [place]   Highest education level: Not on file  Occupational History   Occupation: farm work   Tobacco Use   Smoking status: Some Days    Types: Cigarettes   Smokeless tobacco: Never   Tobacco comments:    1 pack of cigarettes every other week   Vaping Use   Vaping Use: Never used  Substance and Sexual Activity   Alcohol use: Not Currently    Comment: twice weekly, usually on weekends   Drug use: No   Sexual activity: Yes  Other Topics Concern   Not on file  Social History Narrative   Not on file   Social Determinants of Health   Financial Resource Strain: Not on file  Food Insecurity: Not on file  Transportation Needs: Not on file  Physical Activity: Not on file  Stress: Not on file  Social Connections: Not on file  Intimate Partner Violence: Not on file    Family History  Problem Relation Age of Onset   Cancer Other  family history    Diabetes Other        family history    Cancer Mother    Cancer Maternal Grandfather    Cancer Paternal Grandfather      Review of Systems  All other systems reviewed and are negative.      Objective:   Physical Exam Vitals reviewed.  Constitutional:      General: He is not in acute distress.    Appearance: Normal appearance. He is well-developed. He is obese. He is not ill-appearing, toxic-appearing or diaphoretic.  HENT:     Head: Normocephalic and atraumatic.     Right Ear: External ear normal.     Left Ear: External ear normal.     Nose: Nose normal.     Mouth/Throat:     Mouth: Mucous membranes are moist.     Pharynx: Oropharynx is clear. No oropharyngeal exudate or posterior oropharyngeal erythema.  Eyes:     Extraocular Movements:  Extraocular movements intact.     Conjunctiva/sclera: Conjunctivae normal.     Pupils: Pupils are equal, round, and reactive to light.  Cardiovascular:     Rate and Rhythm: Normal rate and regular rhythm.     Heart sounds: Normal heart sounds. No murmur heard.    No friction rub. No gallop.  Pulmonary:     Effort: Pulmonary effort is normal. No respiratory distress.     Breath sounds: Normal breath sounds. No stridor. No wheezing, rhonchi or rales.  Chest:     Chest wall: No tenderness.  Abdominal:     General: Bowel sounds are normal. There is no distension.     Palpations: Abdomen is soft. There is no mass.     Tenderness: There is no abdominal tenderness. There is no guarding or rebound.     Hernia: No hernia is present.  Musculoskeletal:     Right lower leg: No edema.     Left lower leg: No edema.  Skin:    General: Skin is warm.     Coloration: Skin is not jaundiced or pale.     Findings: No bruising, erythema, lesion or rash.  Neurological:     General: No focal deficit present.     Mental Status: He is alert and oriented to person, place, and time. Mental status is at baseline.     Cranial Nerves: No cranial nerve deficit.     Sensory: No sensory deficit.     Motor: No weakness.     Coordination: Coordination normal.     Gait: Gait normal.  Psychiatric:        Mood and Affect: Mood normal.        Behavior: Behavior normal.        Thought Content: Thought content normal.        Judgment: Judgment normal.           Assessment & Plan:  Colon cancer screening - Plan: Cologuard  Dyslipidemia - Plan: CBC with Differential/Platelet, Lipid panel, COMPLETE METABOLIC PANEL WITH GFR  General medical exam  Need for immunization against influenza To help lose weight I recommended discontinuation of all beverages that are high in carbohydrates and calories such as sweet tea, Gatorade, and alcohol.  Replace them with Gatorade 0, unsweet tea, or drink water.  Also  recommended reducing calories.  Recommended alcohol cessation given his history of fatty liver disease.  Check CBC CMP and lipid panel.  Certainly would be a candidate for Wegovy if insurance will cover this.  Receive up-to-date.  Received his tetanus shot today.  Recommended colon cancer screening and he consents to a Cologuard.

## 2022-03-08 NOTE — Addendum Note (Signed)
Addended by: Randal Buba K on: 03/08/2022 11:18 AM   Modules accepted: Orders

## 2022-03-09 LAB — LIPID PANEL
Cholesterol: 230 mg/dL — ABNORMAL HIGH (ref <200)
HDL: 55 mg/dL (ref >=40)
LDL Cholesterol (Calc): 135 mg/dL (calc) — ABNORMAL HIGH
Non-HDL Cholesterol (Calc): 175 mg/dL (calc) — ABNORMAL HIGH (ref <130)
Total CHOL/HDL Ratio: 4.2 (calc) (ref <5.0)
Triglycerides: 258 mg/dL — ABNORMAL HIGH (ref <150)

## 2022-03-09 LAB — CBC WITH DIFFERENTIAL/PLATELET
Absolute Monocytes: 510 cells/uL (ref 200–950)
Basophils Absolute: 57 cells/uL (ref 0–200)
Basophils Relative: 0.7 %
Eosinophils Absolute: 170 cells/uL (ref 15–500)
Eosinophils Relative: 2.1 %
HCT: 49.2 % (ref 38.5–50.0)
Hemoglobin: 17.2 g/dL — ABNORMAL HIGH (ref 13.2–17.1)
Lymphs Abs: 2252 cells/uL (ref 850–3900)
MCH: 32.1 pg (ref 27.0–33.0)
MCHC: 35 g/dL (ref 32.0–36.0)
MCV: 92 fL (ref 80.0–100.0)
MPV: 11.6 fL (ref 7.5–12.5)
Monocytes Relative: 6.3 %
Neutro Abs: 5111 cells/uL (ref 1500–7800)
Neutrophils Relative %: 63.1 %
Platelets: 157 10*3/uL (ref 140–400)
RBC: 5.35 10*6/uL (ref 4.20–5.80)
RDW: 12.5 % (ref 11.0–15.0)
Total Lymphocyte: 27.8 %
WBC: 8.1 10*3/uL (ref 3.8–10.8)

## 2022-03-09 LAB — COMPLETE METABOLIC PANEL WITH GFR
AG Ratio: 1.7 (calc) (ref 1.0–2.5)
ALT: 73 U/L — ABNORMAL HIGH (ref 9–46)
AST: 42 U/L — ABNORMAL HIGH (ref 10–40)
Albumin: 4.5 g/dL (ref 3.6–5.1)
Alkaline phosphatase (APISO): 104 U/L (ref 36–130)
BUN: 11 mg/dL (ref 7–25)
CO2: 27 mmol/L (ref 20–32)
Calcium: 9.4 mg/dL (ref 8.6–10.3)
Chloride: 100 mmol/L (ref 98–110)
Creat: 1.04 mg/dL (ref 0.60–1.29)
Globulin: 2.6 g/dL (calc) (ref 1.9–3.7)
Glucose, Bld: 108 mg/dL — ABNORMAL HIGH (ref 65–99)
Potassium: 4.4 mmol/L (ref 3.5–5.3)
Sodium: 137 mmol/L (ref 135–146)
Total Bilirubin: 0.9 mg/dL (ref 0.2–1.2)
Total Protein: 7.1 g/dL (ref 6.1–8.1)
eGFR: 90 mL/min/{1.73_m2} (ref >=60)

## 2022-04-01 ENCOUNTER — Encounter: Payer: Self-pay | Admitting: Radiology

## 2022-04-04 DIAGNOSIS — Z1211 Encounter for screening for malignant neoplasm of colon: Secondary | ICD-10-CM | POA: Diagnosis not present

## 2022-04-12 LAB — COLOGUARD: COLOGUARD: POSITIVE — AB

## 2022-04-13 ENCOUNTER — Other Ambulatory Visit: Payer: Self-pay

## 2022-04-13 DIAGNOSIS — Z1211 Encounter for screening for malignant neoplasm of colon: Secondary | ICD-10-CM

## 2022-06-29 DIAGNOSIS — L03032 Cellulitis of left toe: Secondary | ICD-10-CM | POA: Diagnosis not present

## 2022-06-29 DIAGNOSIS — M79672 Pain in left foot: Secondary | ICD-10-CM | POA: Diagnosis not present

## 2022-06-29 DIAGNOSIS — M79675 Pain in left toe(s): Secondary | ICD-10-CM | POA: Diagnosis not present

## 2022-06-29 DIAGNOSIS — L600 Ingrowing nail: Secondary | ICD-10-CM | POA: Diagnosis not present

## 2022-07-13 DIAGNOSIS — M79675 Pain in left toe(s): Secondary | ICD-10-CM | POA: Diagnosis not present

## 2022-07-13 DIAGNOSIS — M79672 Pain in left foot: Secondary | ICD-10-CM | POA: Diagnosis not present

## 2022-07-13 DIAGNOSIS — L03032 Cellulitis of left toe: Secondary | ICD-10-CM | POA: Diagnosis not present

## 2022-07-24 DIAGNOSIS — Z6836 Body mass index (BMI) 36.0-36.9, adult: Secondary | ICD-10-CM | POA: Diagnosis not present

## 2022-07-24 DIAGNOSIS — R051 Acute cough: Secondary | ICD-10-CM | POA: Diagnosis not present

## 2022-07-26 ENCOUNTER — Ambulatory Visit
Admission: EM | Admit: 2022-07-26 | Discharge: 2022-07-26 | Disposition: A | Discharge status: Home / Self Care | Payer: BC Managed Care – PPO | Attending: Nurse Practitioner | Admitting: Nurse Practitioner

## 2022-07-26 ENCOUNTER — Ambulatory Visit (INDEPENDENT_AMBULATORY_CARE_PROVIDER_SITE_OTHER): Payer: BC Managed Care – PPO

## 2022-07-26 DIAGNOSIS — R062 Wheezing: Secondary | ICD-10-CM | POA: Diagnosis not present

## 2022-07-26 DIAGNOSIS — J189 Pneumonia, unspecified organism: Secondary | ICD-10-CM

## 2022-07-26 DIAGNOSIS — R059 Cough, unspecified: Secondary | ICD-10-CM | POA: Diagnosis not present

## 2022-07-26 DIAGNOSIS — F17200 Nicotine dependence, unspecified, uncomplicated: Secondary | ICD-10-CM

## 2022-07-26 MED ORDER — METHYLPREDNISOLONE ACETATE 40 MG/ML IJ SUSP
40.0000 mg | Freq: Once | INTRAMUSCULAR | Status: AC
  Administered 2022-07-26: 40 mg via INTRAMUSCULAR

## 2022-07-26 MED ORDER — AZITHROMYCIN 250 MG PO TABS: ORAL_TABLET | ORAL | 0 refills | Status: DC

## 2022-07-26 MED ORDER — AMOXICILLIN-POT CLAVULANATE 875-125 MG PO TABS: 1.0000 | ORAL_TABLET | Freq: Two times a day (BID) | ORAL | 0 refills | Status: DC

## 2022-07-26 MED ORDER — PROMETHAZINE-DM 6.25-15 MG/5ML PO SYRP: 5.0000 mL | ORAL_SOLUTION | Freq: Every evening | ORAL | 0 refills | Status: DC | PRN

## 2022-07-26 MED ORDER — PREDNISONE 20 MG PO TABS: 40.0000 mg | ORAL_TABLET | Freq: Every day | ORAL | 0 refills | Status: AC

## 2022-07-26 MED ORDER — ALBUTEROL SULFATE HFA 108 (90 BASE) MCG/ACT IN AERS: 1.0000 | INHALATION_SPRAY | Freq: Four times a day (QID) | RESPIRATORY_TRACT | 0 refills | Status: DC | PRN

## 2022-07-26 MED ORDER — IPRATROPIUM-ALBUTEROL 0.5-2.5 (3) MG/3ML IN SOLN
3.0000 mL | Freq: Once | RESPIRATORY_TRACT | Status: AC
  Administered 2022-07-26: 3 mL via RESPIRATORY_TRACT

## 2022-07-26 NOTE — Discharge Instructions (Signed)
The chest x-ray today shows possible pneumonia of your left lower lung.  Please take both the Augmentin and azithromycin as prescribed.  Do not take any colchicine while you are taking the azithromycin.  We have given you a breathing treatment which helped open up your airway.  Please continue the albuterol inhaler every 4-6 hours for the next 24 hours, thereafter you can use as needed every 4-6 hours for wheezing or shortness of breath or bronchospasm.  Start the oral prednisone tomorrow as we have given you a shot of steroid today in urgent care.  You can use a cough liquid at nighttime as needed for dry cough, continue the Tessalon Perles during the day for bronchospasm or dry hacking cough.  Make sure you are drinking plenty of fluids.  You can start Mucinex 600 mg twice daily to help break up congestion if you feel congested.  Start deep breathing exercises.  Recommend follow-up with PCP in approximately 6 weeks for chest x-ray recheck to ensure pneumonia is fully improved.

## 2022-07-26 NOTE — ED Provider Notes (Signed)
[facility]    CSN: [id number] Arrival date & time: 07/26/22  0911      History   Chief Complaint Chief Complaint  Patient presents with   Cough    HPI Sergio Gonzalez is a 47 y.o. male.   Patient presents today with wife for more than 1 week of congested cough, shortness of breath that is worse in the past few days, wheezing, chest tightness and chest congestion, sore throat, headache when he coughs, diarrhea, decreased appetite, and fatigue.  Reports he initially had a fever, although this is now improved and he has no body aches or chills.  No overt chest pain, runny or stuffy nose, ear pain, abdominal pain, nausea or vomiting.  Wife reports he gets very tired and short of breath with minimal activities.  Reports daughter tested positive for influenza B a couple of weeks ago and then developed double pneumonia.  Patient has been taking TheraFlu, DayQuil, Mucinex throat drops, Tessalon Perles, and daughter's lidocaine rinses with only mild improvement in symptoms.  Reports cigarette smoking history, only "smoke when I drink" and has not smoked in the past couple of weeks.  Reports he has smoked occasionally since age of 65.  He denies history of chronic lung disease, has never needed inhalers as a child or as an adult.    Past Medical History:  Diagnosis Date   Avascular necrosis of femur head, right (HCC)    GERD (gastroesophageal reflux disease)    H/O hiatal hernia    Hyperlipidemia    Smoker     Patient Active Problem List   Diagnosis Date Noted   Personal history of COVID-19 10/12/2019   Tobacco abuse counseling 10/12/2019   Family history of heart disease 10/12/2019   Smoker    HIP PAIN 04/18/2007    Past Surgical History:  Procedure Laterality Date   ANTERIOR CERVICAL DECOMP/DISCECTOMY FUSION  08/20/2011   Procedure: ANTERIOR CERVICAL DECOMPRESSION/DISCECTOMY FUSION 2 LEVELS;  Surgeon: [name], MD;  Location: [facility];  Service:  Neurosurgery;  Laterality: Bilateral;  Cervical five-six, six-seven anterior cervical discectomy with fusion   EYE SURGERY     lasic     SPINE SURGERY     TONSILLECTOMY         Home Medications    Prior to Admission medications   Medication Sig Start Date End Date Taking? Authorizing Provider  albuterol (VENTOLIN HFA) 108 (90 Base) MCG/ACT inhaler Inhale 1-2 puffs into the lungs every 6 (six) hours as needed for wheezing or shortness of breath. 07/26/22  Yes Cathlean Marseilles A, NP  allopurinol (ZYLOPRIM) 300 MG tablet TAKE 1 TABLET(300 MG) BY MOUTH DAILY 10/02/21  Yes [name], MD  amoxicillin-clavulanate (AUGMENTIN) 875-125 MG tablet Take 1 tablet by mouth every 12 (twelve) hours. 07/26/22  Yes Valentino Nose, NP  azithromycin (ZITHROMAX) 250 MG tablet Take (2) tablets by mouth on day 1, then take (1) tablet by mouth on days 2-5. 07/26/22  Yes Valentino Nose, NP  omeprazole (PRILOSEC) 40 MG capsule TAKE 1 CAPSULE(40 MG) BY MOUTH DAILY 01/04/22  Yes [name], MD  pravastatin (PRAVACHOL) 20 MG tablet TAKE 1 TABLET(20 MG) BY MOUTH DAILY 01/04/22  Yes [name], MD  predniSONE (DELTASONE) 20 MG tablet Take 2 tablets (40 mg total) by mouth daily with breakfast for 5 days. 07/26/22 07/31/22 Yes Valentino Nose, NP  promethazine-dextromethorphan (PROMETHAZINE-DM) 6.25-15 MG/5ML syrup Take 5 mLs by mouth at bedtime as needed  for cough. Do not take with alcohol or while driving or operating heavy machinery.  May cause drowsiness. 07/26/22  Yes Cathlean Marseilles A, NP  colchicine 0.6 MG tablet TAKE 2 TABLETS BY MOUTH AT FIRST SIGN OF GOUT, THEN 1 TABLET IN 1 HOURS IF PAIN CONTINUES 06/16/21   [name], MD  sildenafil (VIAGRA) 100 MG tablet Take 0.5-1 tablets (50-100 mg total) by mouth daily as needed for erectile dysfunction. 10/09/20   [name], MD    Family History Family History  Problem Relation Age of Onset   Cancer Other        family history     Diabetes Other        family history    Cancer Mother    Cancer Maternal Grandfather    Cancer Paternal Grandfather     Social History Social History   Tobacco Use   Smoking status: Some Days    Types: Cigarettes   Smokeless tobacco: Never   Tobacco comments:    1 pack of cigarettes every other week   Vaping Use   Vaping Use: Never used  Substance Use Topics   Alcohol use: Not Currently    Comment: twice weekly, usually on weekends   Drug use: No     Allergies   Patient has no known allergies.   Review of Systems Review of Systems Per HPI  Physical Exam Triage Vital Signs ED Triage Vitals  Enc Vitals Group     BP 07/26/22 1014 122/81     Pulse Rate 07/26/22 1014 90     Resp 07/26/22 1014 19     Temp 07/26/22 1014 98.4 F (36.9 C)     Temp Source 07/26/22 1014 Oral     SpO2 07/26/22 1014 96 %     Weight --      Height --      Head Circumference --      Peak Flow --      Pain Score 07/26/22 1015 0     Pain Loc --      Pain Edu? --      Excl. in GC? --    No data found.  Updated Vital Signs BP 122/81 (BP Location: Right Arm)   Pulse 86   Temp 98.4 F (36.9 C) (Oral)   Resp 19   SpO2 96%   Visual Acuity Right Eye Distance:   Left Eye Distance:   Bilateral Distance:    Right Eye Near:   Left Eye Near:    Bilateral Near:     Physical Exam Vitals and nursing note reviewed.  Constitutional:      General: He is not in acute distress.    Appearance: Normal appearance. He is not ill-appearing or toxic-appearing.  HENT:     Head: Normocephalic and atraumatic.     Right Ear: Tympanic membrane, ear canal and external ear normal.     Left Ear: Tympanic membrane, ear canal and external ear normal.     Nose: No congestion or rhinorrhea.     Mouth/Throat:     Mouth: Mucous membranes are moist.     Pharynx: Oropharynx is clear. Posterior oropharyngeal erythema present. No oropharyngeal exudate.  Eyes:     General: No scleral icterus.     Extraocular Movements: Extraocular movements intact.  Cardiovascular:     Rate and Rhythm: Normal rate and regular rhythm.  Pulmonary:     Effort: Pulmonary effort is normal. No respiratory distress.  Breath sounds: Decreased air movement present. Wheezing and rhonchi present. No rales.  Abdominal:     General: Abdomen is flat. Bowel sounds are normal. There is no distension.     Palpations: Abdomen is soft.  Musculoskeletal:     Cervical back: Normal range of motion and neck supple.  Lymphadenopathy:     Cervical: No cervical adenopathy.  Skin:    General: Skin is warm and dry.     Coloration: Skin is not jaundiced or pale.     Findings: No erythema or rash.  Neurological:     Mental Status: He is alert and oriented to person, place, and time.  Psychiatric:        Behavior: Behavior is cooperative.      UC Treatments / Results  Labs (all labs ordered are listed, but only abnormal results are displayed) Labs Reviewed - No data to display  EKG   Radiology DG Chest 2 View  Result Date: 07/26/2022 CLINICAL DATA:  Cough over the last week EXAM: CHEST - 2 VIEW COMPARISON:  10/01/2019.  08/04/2006.  Chest CT 10/03/2019. FINDINGS: Heart size is normal. There is no lobar consolidation, collapse or pleural effusion. Question mild patchy density at the left lung base which could represent bronchitis or mild patchy pneumonia. No abnormal acute bone finding. Previous cervical fusion surgery. IMPRESSION: Possible mild patchy density at the left lung base which could represent bronchitis or mild patchy pneumonia. Electronically Signed   By: Paulina Fusi M.D.   On: 07/26/2022 10:39    Procedures Procedures (including critical care time)  Medications Ordered in UC Medications  ipratropium-albuterol (DUONEB) 0.5-2.5 (3) MG/3ML nebulizer solution 3 mL (3 mLs Nebulization Given 07/26/22 1059)  methylPREDNISolone acetate (DEPO-MEDROL) injection 40 mg (40 mg Intramuscular Given 07/26/22 1059)     Initial Impression / Assessment and Plan / UC Course  I have reviewed the triage vital signs and the nursing notes.  Pertinent labs & imaging results that were available during my care of the patient were reviewed by me and considered in my medical decision making (see chart for details).   Patient is well-appearing, normotensive, afebrile, not tachycardic, not tachypneic, oxygenating well on room air.    1. Pneumonia of left lower lobe due to infectious organism 2. Current every day smoker 3. Wheezing Chest x-ray today shows patchy opacity of left lower lobe Patient is high risk given history of smoking; will treat with Augmentin twice daily for 7 days along with azithromycin DuoNeb breathing treatment given today in urgent care with improvement in air movement bilateral lung fields; start albuterol inhaler at home every 4-6 hours as needed for wheezing/shortness of breath Depo-Medrol given in urgent care today for lung inflammation; start oral prednisone tomorrow morning Other supportive care discussed with patient and wife Recommended follow-up with PCP in approximately 6 weeks for chest x-ray recheck to ensure full resolution of pneumonia Strict ER precautions discussed in the meantime  The patient was given the opportunity to ask questions.  All questions answered to their satisfaction.  The patient is in agreement to this plan.    Final Clinical Impressions(s) / UC Diagnoses   Final diagnoses:  Pneumonia of left lower lobe due to infectious organism  Current every day smoker  Wheezing     Discharge Instructions      The chest x-ray today shows possible pneumonia of your left lower lung.  Please take both the Augmentin and azithromycin as prescribed.  Do not take any colchicine while you  are taking the azithromycin.  We have given you a breathing treatment which helped open up your airway.  Please continue the albuterol inhaler every 4-6 hours for the next 24 hours,  thereafter you can use as needed every 4-6 hours for wheezing or shortness of breath or bronchospasm.  Start the oral prednisone tomorrow as we have given you a shot of steroid today in urgent care.  You can use a cough liquid at nighttime as needed for dry cough, continue the Tessalon Perles during the day for bronchospasm or dry hacking cough.  Make sure you are drinking plenty of fluids.  You can start Mucinex 600 mg twice daily to help break up congestion if you feel congested.  Start deep breathing exercises.  Recommend follow-up with PCP in approximately 6 weeks for chest x-ray recheck to ensure pneumonia is fully improved.     ED Prescriptions     Medication Sig Dispense Auth. Provider   albuterol (VENTOLIN HFA) 108 (90 Base) MCG/ACT inhaler Inhale 1-2 puffs into the lungs every 6 (six) hours as needed for wheezing or shortness of breath. 18 g Cathlean Marseilles A, NP   predniSONE (DELTASONE) 20 MG tablet Take 2 tablets (40 mg total) by mouth daily with breakfast for 5 days. 10 tablet Cathlean Marseilles A, NP   amoxicillin-clavulanate (AUGMENTIN) 875-125 MG tablet Take 1 tablet by mouth every 12 (twelve) hours. 14 tablet Cathlean Marseilles A, NP   azithromycin (ZITHROMAX) 250 MG tablet Take (2) tablets by mouth on day 1, then take (1) tablet by mouth on days 2-5. 6 tablet Cathlean Marseilles A, NP   promethazine-dextromethorphan (PROMETHAZINE-DM) 6.25-15 MG/5ML syrup Take 5 mLs by mouth at bedtime as needed for cough. Do not take with alcohol or while driving or operating heavy machinery.  May cause drowsiness. 118 mL Valentino Nose, NP      PDMP not reviewed this encounter.   Valentino Nose, NP 07/26/22 [phone]

## 2022-07-26 NOTE — ED Triage Notes (Signed)
Cough that started a week ago. Daughter was positive for flu B June 8th and developed pneumonia and he was having similar symptoms last week. Pt having productive cough with green mucus, wheezing, headache, fever 101.9 and SOB with exertion. Taking OTC cough syrup, Theraflu with no relief.

## 2022-07-27 DIAGNOSIS — L600 Ingrowing nail: Secondary | ICD-10-CM | POA: Diagnosis not present

## 2022-07-27 DIAGNOSIS — M79671 Pain in right foot: Secondary | ICD-10-CM | POA: Diagnosis not present

## 2022-07-27 DIAGNOSIS — M79674 Pain in right toe(s): Secondary | ICD-10-CM | POA: Diagnosis not present

## 2022-07-27 DIAGNOSIS — L03031 Cellulitis of right toe: Secondary | ICD-10-CM | POA: Diagnosis not present

## 2022-07-31 ENCOUNTER — Other Ambulatory Visit: Payer: Self-pay

## 2022-07-31 ENCOUNTER — Emergency Department (HOSPITAL_COMMUNITY): Payer: BC Managed Care – PPO

## 2022-07-31 ENCOUNTER — Emergency Department (HOSPITAL_COMMUNITY)
Admission: EM | Admit: 2022-07-31 | Discharge: 2022-07-31 | Disposition: A | Discharge status: Home / Self Care | Payer: BC Managed Care – PPO | Attending: Emergency Medicine | Admitting: Emergency Medicine

## 2022-07-31 ENCOUNTER — Encounter (HOSPITAL_COMMUNITY): Payer: Self-pay

## 2022-07-31 DIAGNOSIS — R0602 Shortness of breath: Secondary | ICD-10-CM | POA: Diagnosis not present

## 2022-07-31 DIAGNOSIS — R531 Weakness: Secondary | ICD-10-CM | POA: Diagnosis not present

## 2022-07-31 DIAGNOSIS — D72829 Elevated white blood cell count, unspecified: Secondary | ICD-10-CM | POA: Insufficient documentation

## 2022-07-31 DIAGNOSIS — R059 Cough, unspecified: Secondary | ICD-10-CM | POA: Diagnosis not present

## 2022-07-31 DIAGNOSIS — R55 Syncope and collapse: Secondary | ICD-10-CM | POA: Insufficient documentation

## 2022-07-31 DIAGNOSIS — K7689 Other specified diseases of liver: Secondary | ICD-10-CM | POA: Diagnosis not present

## 2022-07-31 DIAGNOSIS — E860 Dehydration: Secondary | ICD-10-CM | POA: Diagnosis not present

## 2022-07-31 DIAGNOSIS — R42 Dizziness and giddiness: Secondary | ICD-10-CM | POA: Diagnosis not present

## 2022-07-31 LAB — CBC WITH DIFFERENTIAL/PLATELET
Abs Immature Granulocytes: 0.1 10*3/uL — ABNORMAL HIGH (ref 0.00–0.07)
Basophils Absolute: 0.1 10*3/uL (ref 0.0–0.1)
Basophils Relative: 1 %
Eosinophils Absolute: 0 10*3/uL (ref 0.0–0.5)
Eosinophils Relative: 0 %
HCT: 45.5 % (ref 39.0–52.0)
Hemoglobin: 15.4 g/dL (ref 13.0–17.0)
Immature Granulocytes: 1 %
Lymphocytes Relative: 19 %
Lymphs Abs: 2.4 10*3/uL (ref 0.7–4.0)
MCH: 31 pg (ref 26.0–34.0)
MCHC: 33.8 g/dL (ref 30.0–36.0)
MCV: 91.7 fL (ref 80.0–100.0)
Monocytes Absolute: 0.9 10*3/uL (ref 0.1–1.0)
Monocytes Relative: 7 %
Neutro Abs: 9.1 10*3/uL — ABNORMAL HIGH (ref 1.7–7.7)
Neutrophils Relative %: 72 %
Platelets: 253 10*3/uL (ref 150–400)
RBC: 4.96 MIL/uL (ref 4.22–5.81)
RDW: 12.9 % (ref 11.5–15.5)
WBC: 12.5 10*3/uL — ABNORMAL HIGH (ref 4.0–10.5)
nRBC: 0 % (ref 0.0–0.2)

## 2022-07-31 LAB — URINALYSIS, ROUTINE W REFLEX MICROSCOPIC
Bilirubin Urine: NEGATIVE
Glucose, UA: NEGATIVE mg/dL
Hgb urine dipstick: NEGATIVE
Ketones, ur: NEGATIVE mg/dL
Leukocytes,Ua: NEGATIVE
Nitrite: NEGATIVE
Protein, ur: NEGATIVE mg/dL
Specific Gravity, Urine: 1.015 (ref 1.005–1.030)
pH: 5 (ref 5.0–8.0)

## 2022-07-31 LAB — COMPREHENSIVE METABOLIC PANEL
ALT: 55 U/L — ABNORMAL HIGH (ref 0–44)
AST: 26 U/L (ref 15–41)
Albumin: 4.2 g/dL (ref 3.5–5.0)
Alkaline Phosphatase: 77 U/L (ref 38–126)
Anion gap: 12 (ref 5–15)
BUN: 16 mg/dL (ref 6–20)
CO2: 19 mmol/L — ABNORMAL LOW (ref 22–32)
Calcium: 9.3 mg/dL (ref 8.9–10.3)
Chloride: 106 mmol/L (ref 98–111)
Creatinine, Ser: 0.97 mg/dL (ref 0.61–1.24)
GFR, Estimated: >60 mL/min (ref >60)
Glucose, Bld: 102 mg/dL — ABNORMAL HIGH (ref 70–99)
Potassium: 3.6 mmol/L (ref 3.5–5.1)
Sodium: 137 mmol/L (ref 135–145)
Total Bilirubin: 0.7 mg/dL (ref 0.3–1.2)
Total Protein: 7.8 g/dL (ref 6.5–8.1)

## 2022-07-31 LAB — TROPONIN I (HIGH SENSITIVITY)
Troponin I (High Sensitivity): <2 ng/L (ref <18)
Troponin I (High Sensitivity): 2 ng/L (ref <18)

## 2022-07-31 LAB — CBG MONITORING, ED: Glucose-Capillary: 95 mg/dL (ref 70–99)

## 2022-07-31 MED ORDER — SODIUM CHLORIDE 0.9 % IV BOLUS
1000.0000 mL | Freq: Once | INTRAVENOUS | Status: AC
  Administered 2022-07-31: 1000 mL via INTRAVENOUS

## 2022-07-31 MED ORDER — SODIUM CHLORIDE 0.9 % IV SOLN: INTRAVENOUS | Status: DC

## 2022-07-31 MED ORDER — IOHEXOL 350 MG/ML SOLN
100.0000 mL | Freq: Once | INTRAVENOUS | Status: AC | PRN
  Administered 2022-07-31: 100 mL via INTRAVENOUS

## 2022-07-31 NOTE — ED Provider Notes (Signed)
EMERGENCY DEPARTMENT AT [facility] Provider Note   CSN: [id number] Arrival date & time: 07/31/22  1900     History  Chief Complaint  Patient presents with   Loss of Consciousness    Sergio Gonzalez is a 47 y.o. male.  Pt is a 47 yo male with pmhx significant for GERD, tobacco abuse, and HLD.  Pt has had a cough for about a week.  He went to UC on 6/22 and was told he had bronchitis.  He was told to take tessalon perles.  He went back to a different UC on 6/24 and was told he had pna.  He was given a rx for prednisone, zpack, augmentin, and promethazine-dm.  Pt has been tired this week and has been coughing, but seemed to be getting better.  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  This was witnessed by his spouse who was with him.  He said he feels weak now.  No cp.         Home Medications Prior to Admission medications   Medication Sig Start Date End Date Taking? Authorizing Provider  albuterol (VENTOLIN HFA) 108 (90 Base) MCG/ACT inhaler Inhale 1-2 puffs into the lungs every 6 (six) hours as needed for wheezing or shortness of breath. 07/26/22   Valentino Nose, NP  allopurinol (ZYLOPRIM) 300 MG tablet TAKE 1 TABLET(300 MG) BY MOUTH DAILY 10/02/21   [name], MD  amoxicillin-clavulanate (AUGMENTIN) 875-125 MG tablet Take 1 tablet by mouth every 12 (twelve) hours. 07/26/22   Valentino Nose, NP  azithromycin (ZITHROMAX) 250 MG tablet Take (2) tablets by mouth on day 1, then take (1) tablet by mouth on days 2-5. 07/26/22   Valentino Nose, NP  colchicine 0.6 MG tablet TAKE 2 TABLETS BY MOUTH AT FIRST SIGN OF GOUT, THEN 1 TABLET IN 1 HOURS IF PAIN CONTINUES 06/16/21   [name], MD  omeprazole (PRILOSEC) 40 MG capsule TAKE 1 CAPSULE(40 MG) BY MOUTH DAILY 01/04/22   [name], MD  pravastatin (PRAVACHOL) 20 MG tablet TAKE 1 TABLET(20 MG) BY  MOUTH DAILY 01/04/22   [name], MD  predniSONE (DELTASONE) 20 MG tablet Take 2 tablets (40 mg total) by mouth daily with breakfast for 5 days. 07/26/22 07/31/22  Valentino Nose, NP  promethazine-dextromethorphan (PROMETHAZINE-DM) 6.25-15 MG/5ML syrup Take 5 mLs by mouth at bedtime as needed for cough. Do not take with alcohol or while driving or operating heavy machinery.  May cause drowsiness. 07/26/22   Valentino Nose, NP  sildenafil (VIAGRA) 100 MG tablet Take 0.5-1 tablets (50-100 mg total) by mouth daily as needed for erectile dysfunction. 10/09/20   [name], MD      Allergies    Patient has no known allergies.    Review of Systems   Review of Systems  Respiratory:  Positive for cough.   Neurological:  Positive for weakness.  All other systems reviewed and are negative.   Physical Exam Updated Vital Signs BP 116/75   Pulse 74   Temp 98 F (36.7 C) (Oral)   Resp 18   Ht 6\' 4"  (1.93 m)   Wt 134.5 kg   SpO2 97%   BMI 36.09 kg/m  Physical Exam Vitals and nursing note reviewed.  Constitutional:      Appearance: Normal appearance.  HENT:  Head: Normocephalic and atraumatic.     Right Ear: External ear normal.     Left Ear: External ear normal.     Nose: Nose normal.     Mouth/Throat:     Mouth: Mucous membranes are dry.  Eyes:     Extraocular Movements: Extraocular movements intact.     Conjunctiva/sclera: Conjunctivae normal.     Pupils: Pupils are equal, round, and reactive to light.  Cardiovascular:     Rate and Rhythm: Normal rate and regular rhythm.     Pulses: Normal pulses.     Heart sounds: Normal heart sounds.  Pulmonary:     Effort: Pulmonary effort is normal.     Breath sounds: Normal breath sounds.  Abdominal:     General: Abdomen is flat. Bowel sounds are normal.     Palpations: Abdomen is soft.  Musculoskeletal:        General: Normal range of motion.     Cervical back: Normal range of motion and neck supple.  Skin:     General: Skin is warm.     Capillary Refill: Capillary refill takes less than 2 seconds.  Neurological:     General: No focal deficit present.     Mental Status: He is alert and oriented to person, place, and time.  Psychiatric:        Mood and Affect: Mood normal.        Behavior: Behavior normal.     ED Results / Procedures / Treatments   Labs (all labs ordered are listed, but only abnormal results are displayed) Labs Reviewed  CBC WITH DIFFERENTIAL/PLATELET - Abnormal; Notable for the following components:      Result Value   WBC 12.5 (*)    Neutro Abs 9.1 (*)    Abs Immature Granulocytes 0.10 (*)    All other components within normal limits  COMPREHENSIVE METABOLIC PANEL - Abnormal; Notable for the following components:   CO2 19 (*)    Glucose, Bld 102 (*)    ALT 55 (*)    All other components within normal limits  URINALYSIS, ROUTINE W REFLEX MICROSCOPIC  CBG MONITORING, ED  TROPONIN I (HIGH SENSITIVITY)  TROPONIN I (HIGH SENSITIVITY)    EKG EKG Interpretation Date/Time:  Saturday July 31 2022 19:18:41 EDT Ventricular Rate:  84 PR Interval:  174 QRS Duration:  92 QT Interval:  416 QTC Calculation: 491 R Axis:   -20  Text Interpretation: Normal sinus rhythm Minimal voltage criteria for LVH, may be normal variant ( R in aVL ) Prolonged QT Abnormal ECG When compared with ECG of 01-Oct-2019 23:47, PREVIOUS ECG IS PRESENT No significant change since last tracing Confirmed by Jacalyn Lefevre [phone]) on 07/31/2022 8:16:39 PM  Radiology CT Head Wo Contrast  Result Date: 07/31/2022 CLINICAL DATA:  Syncope, weakness EXAM: CT HEAD WITHOUT CONTRAST TECHNIQUE: Contiguous axial images were obtained from the base of the skull through the vertex without intravenous contrast. RADIATION DOSE REDUCTION: This exam was performed according to the departmental dose-optimization program which includes automated exposure control, adjustment of the mA and/or kV according to patient size  and/or use of iterative reconstruction technique. COMPARISON:  None Available. FINDINGS: Brain: No evidence of acute infarction, hemorrhage, hydrocephalus, extra-axial collection or mass lesion/mass effect. Mild cortical atrophy. Vascular: No hyperdense vessel or unexpected calcification. Skull: Normal. Negative for fracture or focal lesion. Sinuses/Orbits: Mild partial opacification of the right maxillary sinus. Visualized paranasal sinuses and mastoid air cells are otherwise clear. Other: None. IMPRESSION: No acute  intracranial abnormality. Mild cortical atrophy. Electronically Signed   By: Charline Bills M.D.   On: 07/31/2022 20:44   CT Angio Chest PE W and/or Wo Contrast  Result Date: 07/31/2022 CLINICAL DATA:  Cough, syncope, weakness EXAM: CT ANGIOGRAPHY CHEST WITH CONTRAST TECHNIQUE: Multidetector CT imaging of the chest was performed using the standard protocol during bolus administration of intravenous contrast. Multiplanar CT image reconstructions and MIPs were obtained to evaluate the vascular anatomy. RADIATION DOSE REDUCTION: This exam was performed according to the departmental dose-optimization program which includes automated exposure control, adjustment of the mA and/or kV according to patient size and/or use of iterative reconstruction technique. CONTRAST:  OMNIPAQUE IOHEXOL 350 MG/ML SOLN COMPARISON:  CTA chest dated 10/03/2019 FINDINGS: Cardiovascular: Satisfactory opacification of the bilateral pulmonary arteries to the segmental level. No evidence of pulmonary embolism. Although not tailored for evaluation of the thoracic aorta, there is no evidence of thoracic aortic aneurysm or dissection. The heart is normal in size.  No pericardial effusion. Mediastinum/Nodes: No suspicious mediastinal lymphadenopathy. Visualized thyroid is unremarkable. Lungs/Pleura: Mild platelike opacity in the medial left lower lobe, favoring atelectasis. No focal consolidation. No suspicious pulmonary  nodules. No pleural effusion or pneumothorax. Upper Abdomen: Visualized upper abdomen is grossly unremarkable, noting 11 mm left hepatic cyst, benign. Musculoskeletal: Cervical spine fixation hardware, incompletely visualized. Review of the MIP images confirms the above findings. IMPRESSION: No evidence of pulmonary embolism. No acute cardiopulmonary disease. Electronically Signed   By: Charline Bills M.D.   On: 07/31/2022 20:43   DG Chest Port 1 View  Result Date: 07/31/2022 CLINICAL DATA:  Shortness of breath.  Syncope.  Weakness. EXAM: PORTABLE CHEST 1 VIEW COMPARISON:  Chest radiograph 07/26/2022, 10/03/2019 FINDINGS: The heart size and mediastinal contours are within normal limits. Both lungs are clear. No pleural effusion or pneumothorax. Partially visualized anterior cervical spinal fixation with intervertebral spacers. IMPRESSION: No active disease. Electronically Signed   By: Sherron Ales M.D.   On: 07/31/2022 20:20    Procedures Procedures    Medications Ordered in ED Medications  sodium chloride 0.9 % bolus 1,000 mL (0 mLs Intravenous Stopped 07/31/22 2221)    And  0.9 %  sodium chloride infusion (0 mLs Intravenous Hold 07/31/22 2003)  iohexol (OMNIPAQUE) 350 MG/ML injection 100 mL (100 mLs Intravenous Contrast Given 07/31/22 2032)  sodium chloride 0.9 % bolus 1,000 mL (0 mLs Intravenous Stopped 07/31/22 2309)    ED Course/ Medical Decision Making/ A&P                             Medical Decision Making Amount and/or Complexity of Data Reviewed Labs: ordered. Radiology: ordered. ECG/medicine tests: ordered.  Risk Prescription drug management.   This patient presents to the ED for concern of syncope, this involves an extensive number of treatment options, and is a complaint that carries with it a high risk of complications and morbidity.  The differential diagnosis includes cardiogenic, orthostatic, vasovagal   Co morbidities that complicate the patient  evaluation  GERD, tobacco abuse, and HLD   Additional history obtained:  Additional history obtained from epic chart review External records from outside source obtained and reviewed including wife   Lab Tests:  I Ordered, and personally interpreted labs.  The pertinent results include:  cbc with wbc elevated at 12.4, trop nl, cmp nl, ua nl   Imaging Studies ordered:  I ordered imaging studies including cxr, ct head, cta chest  I  independently visualized and interpreted imaging which showed  CT head: No acute intracranial abnormality.    Mild cortical atrophy.  CT chest: No evidence of pulmonary embolism.    No acute cardiopulmonary disease.  CXR: No active disease.  I agree with the radiologist interpretation   Cardiac Monitoring:  The patient was maintained on a cardiac monitor.  I personally viewed and interpreted the cardiac monitored which showed an underlying rhythm of: nsr   Medicines ordered and prescription drug management:  I ordered medication including ivfs  for sx  Reevaluation of the patient after these medicines showed that the patient improved I have reviewed the patients home medicines and have made adjustments as needed   Test Considered:  ct   Critical Interventions:  ivfs   Problem List / ED Course:  Syncope:  pt feels much better after fluids.  He could not urinate until after 2nd L, so I think syncope was due to dehydration.  Pt is stable for d/c.  Return if worse.  F/u with pcp.   Reevaluation:  After the interventions noted above, I reevaluated the patient and found that they have :improved   Social Determinants of Health:  Lives at home   Dispostion:  After consideration of the diagnostic results and the patients response to treatment, I feel that the patent would benefit from discharge with outpatient f/u.          Final Clinical Impression(s) / ED Diagnoses Final diagnoses:  Syncope, unspecified syncope type   Dehydration    Rx / DC Orders ED Discharge Orders     None         Jacalyn Lefevre, MD 07/31/22 2319

## 2022-07-31 NOTE — ED Notes (Signed)
Pt ambulated in hall without dizziness, weakness, or pain.

## 2022-07-31 NOTE — ED Triage Notes (Signed)
Pt presents to ED from home with c/o syncope. Pt reports riding on tractor with spouse as passenger, pt started coughing, then everything went black, pt spouse says she shouted at him several times and he came around, but still feels weak. Pt reports "feeling like he was going to "pass out after coughing spell has happened before but he "didn't go all the way out like this time" pt also reports being dx with PNA on Monday and urgent care and starting anbx.

## 2022-08-03 ENCOUNTER — Telehealth: Payer: Self-pay

## 2022-08-03 NOTE — Transitions of Care (Post Inpatient/ED Visit) (Signed)
   08/03/2022  Name: Sergio Gonzalez MRN: [id number] DOB: 1975-03-05  Today's TOC FU Call Status: Today's TOC FU Call Status:: Unsuccessul Call (1st Attempt) Unsuccessful Call (1st Attempt) Date: 08/03/22  Attempted to reach the patient regarding the most recent Inpatient/ED visit.  Follow Up Plan: Additional outreach attempts will be made to reach the patient to complete the Transitions of Care (Post Inpatient/ED visit) call.   Jodelle Gross, RN, BSN, CCM Care Management Coordinator [facility] Phone: [phone]/Fax: [phone]

## 2022-08-04 ENCOUNTER — Telehealth: Payer: Self-pay

## 2022-08-04 NOTE — Transitions of Care (Post Inpatient/ED Visit) (Signed)
08/04/2022  Name: Sergio Gonzalez MRN: [id number] DOB: 12-Jun-1975  Today's TOC FU Call Status: Today's TOC FU Call Status:: Successful TOC FU Call Competed TOC FU Call Complete Date: 08/04/22  Transition Care Management Follow-up Telephone Call Date of Discharge: 07/31/22 [facility] Type of Discharge: Emergency Department Reason for ED Visit: Other: (Syncope) How have you been since you were released from the hospital?: Better Any questions or concerns?: No  Items Reviewed: Did you receive and understand the discharge instructions provided?: Yes Medications obtained,verified, and reconciled?: Yes (Medications Reviewed) Any new allergies since your discharge?: No Dietary orders reviewed?: No Do you have support at home?: Yes People in Home: spouse Name of Support/Comfort Primary Source: Vernona Rieger  Medications Reviewed Today: Medications Reviewed Today     Reviewed by Jodelle Gross, RN (Case Manager) on 08/04/22 at 1134  Med List Status: <None>   Medication Order Taking? Sig Documenting Provider Last Dose Status Informant  albuterol (VENTOLIN HFA) 108 (90 Base) MCG/ACT inhaler [id number] Yes Inhale 1-2 puffs into the lungs every 6 (six) hours as needed for wheezing or shortness of breath. Valentino Nose, NP Taking Active   allopurinol (ZYLOPRIM) 300 MG tablet [id number] Yes TAKE 1 TABLET(300 MG) BY MOUTH DAILY [name], MD Taking Active   amoxicillin-clavulanate (AUGMENTIN) 875-125 MG tablet [id number] No Take 1 tablet by mouth every 12 (twelve) hours.  Patient not taking: Reported on 08/04/2022   Valentino Nose, NP Not Taking Active            Med Note Electa Sniff, [facility]   Wed Aug 04, 2022 11:31 AM) Completed course of antibiotics  azithromycin (ZITHROMAX) 250 MG tablet [id number] No Take (2) tablets by mouth on day 1, then take (1) tablet by mouth on days 2-5.  Patient not taking: Reported on 08/04/2022   Valentino Nose, NP Not Taking Active             Med Note Electa Sniff [facility]   Wed Aug 04, 2022 11:31 AM) Completed course of antibiotics  colchicine 0.6 MG tablet [id number] Yes TAKE 2 TABLETS BY MOUTH AT FIRST SIGN OF GOUT, THEN 1 TABLET IN 1 HOURS IF PAIN CONTINUES [name], MD Taking Active   omeprazole (PRILOSEC) 40 MG capsule [id number] Yes TAKE 1 CAPSULE(40 MG) BY MOUTH DAILY [name], MD Taking Active   pravastatin (PRAVACHOL) 20 MG tablet [id number] Yes TAKE 1 TABLET(20 MG) BY MOUTH DAILY [name], MD Taking Active   promethazine-dextromethorphan (PROMETHAZINE-DM) 6.25-15 MG/5ML syrup [id number] Yes Take 5 mLs by mouth at bedtime as needed for cough. Do not take with alcohol or while driving or operating heavy machinery.  May cause drowsiness. Valentino Nose, NP Taking Active   sildenafil (VIAGRA) 100 MG tablet [id number] No Take 0.5-1 tablets (50-100 mg total) by mouth daily as needed for erectile dysfunction. [name], MD Unknown Active             Home Care and Equipment/Supplies: Were Home Health Services Ordered?: No Any new equipment or medical supplies ordered?: No  Functional Questionnaire: Do you need assistance with bathing/showering or dressing?: No Do you need assistance with meal preparation?: No Do you need assistance with eating?: No Do you have difficulty maintaining continence: No Do you need assistance with getting out of bed/getting out of a chair/moving?: No Do you have difficulty managing or taking your medications?: No  Follow up appointments reviewed: PCP Follow-up appointment confirmed?: Yes Date of PCP follow-up  appointment?: 08/17/22 Follow-up Provider: Dr. Garey Ham Follow-up appointment confirmed?: NA Do you need transportation to your follow-up appointment?: No  SDOH Interventions Today    Flowsheet Row Most Recent Value  SDOH Interventions   Food Insecurity Interventions Intervention Not Indicated  Housing Interventions  Intervention Not Indicated  Transportation Interventions Intervention Not Indicated      Jodelle Gross, RN, BSN, CCM Care Management Coordinator [facility] Phone: [phone]/Fax: [phone]

## 2022-08-17 ENCOUNTER — Encounter: Payer: Self-pay | Admitting: Family Medicine

## 2022-08-17 ENCOUNTER — Ambulatory Visit: Payer: BC Managed Care – PPO | Admitting: Family Medicine

## 2022-08-17 VITALS — BP 132/86 | HR 99 | Temp 98.4°F | Ht 76.0 in | Wt 298.0 lb

## 2022-08-17 DIAGNOSIS — E7800 Pure hypercholesterolemia, unspecified: Secondary | ICD-10-CM | POA: Diagnosis not present

## 2022-08-17 DIAGNOSIS — R195 Other fecal abnormalities: Secondary | ICD-10-CM

## 2022-08-17 DIAGNOSIS — L600 Ingrowing nail: Secondary | ICD-10-CM | POA: Diagnosis not present

## 2022-08-17 DIAGNOSIS — M79675 Pain in left toe(s): Secondary | ICD-10-CM | POA: Diagnosis not present

## 2022-08-17 DIAGNOSIS — M79672 Pain in left foot: Secondary | ICD-10-CM | POA: Diagnosis not present

## 2022-08-17 DIAGNOSIS — L03032 Cellulitis of left toe: Secondary | ICD-10-CM | POA: Diagnosis not present

## 2022-08-17 NOTE — Progress Notes (Signed)
Subjective:    Patient ID: Sergio Gonzalez, male    DOB: 16-Jul-1975, 47 y.o.   MRN: [id number]   Patient was seen at an urgent care at the end of June and was diagnosed with left lower lobe pneumonia.  He was given steroids due to wheezing, albuterol, and started on Augmentin and a Z-Pak.  I reviewed the chest x-ray and there is patchy opacity in the left lower lobe.  On June 29, the patient had a coughing spell while driving a tractor.  The patient experienced syncope after a coughing fit.  This was witnessed by his wife who was riding with him in the tractor.  He temporarily lost consciousness regaining it just a few moments later.  He went to the emergency room.  CT angiogram of the chest revealed no pulmonary embolism.  It also showed no residual pneumonia.  Repeat portable chest x-ray was clear.  CT scan of the brain was normal.  Lab work showed negative troponins x 2.  They felt that the patient was dehydrated.  He has done much better since.  He is back to work.  He denies any chest pain or pleurisy.  He has quit smoking. Past Medical History:  Diagnosis Date   Avascular necrosis of femur head, right (HCC)    GERD (gastroesophageal reflux disease)    H/O hiatal hernia    Hyperlipidemia    Smoker    Past Surgical History:  Procedure Laterality Date   ANTERIOR CERVICAL DECOMP/DISCECTOMY FUSION  08/20/2011   Procedure: ANTERIOR CERVICAL DECOMPRESSION/DISCECTOMY FUSION 2 LEVELS;  Surgeon: [name], MD;  Location: [facility];  Service: Neurosurgery;  Laterality: Bilateral;  Cervical five-six, six-seven anterior cervical discectomy with fusion   EYE SURGERY     lasic     SPINE SURGERY     TONSILLECTOMY     Current Outpatient Medications on File Prior to Visit  Medication Sig Dispense Refill   albuterol (VENTOLIN HFA) 108 (90 Base) MCG/ACT inhaler Inhale 1-2 puffs into the lungs every 6 (six) hours as needed for wheezing or shortness of breath. 18 g 0   allopurinol (ZYLOPRIM) 300 MG  tablet TAKE 1 TABLET(300 MG) BY MOUTH DAILY 90 tablet 3   colchicine 0.6 MG tablet TAKE 2 TABLETS BY MOUTH AT FIRST SIGN OF GOUT, THEN 1 TABLET IN 1 HOURS IF PAIN CONTINUES 90 tablet 2   omeprazole (PRILOSEC) 40 MG capsule TAKE 1 CAPSULE(40 MG) BY MOUTH DAILY 90 capsule 3   pravastatin (PRAVACHOL) 20 MG tablet TAKE 1 TABLET(20 MG) BY MOUTH DAILY 90 tablet 3   sildenafil (VIAGRA) 100 MG tablet Take 0.5-1 tablets (50-100 mg total) by mouth daily as needed for erectile dysfunction. 5 tablet 11   No current facility-administered medications on file prior to visit.   No Known Allergies Social History   Socioeconomic History   Marital status: Married    Spouse name: Not on file   Number of children: Not on file   Years of education: 77yr [employer]   Highest education level: Not on file  Occupational History   Occupation: farm work   Tobacco Use   Smoking status: Some Days    Types: Cigarettes   Smokeless tobacco: Never   Tobacco comments:    1 pack of cigarettes every other week   Vaping Use   Vaping status: Never Used  Substance and Sexual Activity   Alcohol use: Not Currently    Comment: twice weekly, usually on weekends  Drug use: No   Sexual activity: Yes  Other Topics Concern   Not on file  Social History Narrative   Not on file   Social Determinants of Health   Financial Resource Strain: Not on file  Food Insecurity: No Food Insecurity (08/04/2022)   Hunger Vital Sign    Worried About Running Out of Food in the Last Year: Never true    Ran Out of Food in the Last Year: Never true  Transportation Needs: No Transportation Needs (08/04/2022)   PRAPARE - Administrator, Civil Service (Medical): No    Lack of Transportation (Non-Medical): No  Physical Activity: Not on file  Stress: Not on file  Social Connections: Not on file  Intimate Partner Violence: Not on file    Family History  Problem Relation Age of Onset   Cancer Other        family history    Diabetes  Other        family history    Cancer Mother    Cancer Maternal Grandfather    Cancer Paternal Grandfather      Review of Systems  All other systems reviewed and are negative.      Objective:   Physical Exam Vitals reviewed.  Constitutional:      General: He is not in acute distress.    Appearance: Normal appearance. He is well-developed. He is obese. He is not ill-appearing, toxic-appearing or diaphoretic.  HENT:     Head: Normocephalic and atraumatic.     Right Ear: External ear normal.     Left Ear: External ear normal.     Nose: Nose normal.     Mouth/Throat:     Mouth: Mucous membranes are moist.     Pharynx: Oropharynx is clear. No oropharyngeal exudate or posterior oropharyngeal erythema.  Eyes:     Extraocular Movements: Extraocular movements intact.     Conjunctiva/sclera: Conjunctivae normal.     Pupils: Pupils are equal, round, and reactive to light.  Cardiovascular:     Rate and Rhythm: Normal rate and regular rhythm.     Heart sounds: Normal heart sounds. No murmur heard.    No friction rub. No gallop.  Pulmonary:     Effort: Pulmonary effort is normal. No respiratory distress.     Breath sounds: Normal breath sounds. No stridor. No wheezing, rhonchi or rales.  Chest:     Chest wall: No tenderness.  Abdominal:     General: Bowel sounds are normal. There is no distension.     Palpations: Abdomen is soft. There is no mass.     Tenderness: There is no abdominal tenderness. There is no guarding or rebound.     Hernia: No hernia is present.  Musculoskeletal:     Right lower leg: No edema.     Left lower leg: No edema.  Skin:    General: Skin is warm.     Coloration: Skin is not jaundiced or pale.     Findings: No bruising, erythema, lesion or rash.  Neurological:     General: No focal deficit present.     Mental Status: He is alert and oriented to person, place, and time. Mental status is at baseline.     Cranial Nerves: No cranial nerve deficit.      Sensory: No sensory deficit.     Motor: No weakness.     Coordination: Coordination normal.     Gait: Gait normal.  Psychiatric:  Mood and Affect: Mood normal.        Behavior: Behavior normal.        Thought Content: Thought content normal.        Judgment: Judgment normal.           Assessment & Plan:  Pure hypercholesterolemia - Plan: CT CARDIAC SCORING (SELF PAY ONLY)  Positive colorectal cancer screening using Cologuard test - Plan: Ambulatory referral to Gastroenterology I believe the patient had a vasovagal syncopal episode brought on by Valsalva maneuver from coughing.  I believe the dehydration played a role.  This seems to been self-limited and have resolved.  No further treatment is necessary although I did recommend smoking cessation and the patient is working on this.  We also discussed a coronary artery calcium score.  He is not aggressively treating his cholesterol at the present time.  He would like to get the CT cardiac score to further evaluate his cardiovascular risk and help determine if he wants to aggressively treat his cholesterol.  He also had a positive Cologuard test in March.  I will consult GI for colonoscopy

## 2022-08-19 ENCOUNTER — Encounter (INDEPENDENT_AMBULATORY_CARE_PROVIDER_SITE_OTHER): Payer: Self-pay | Admitting: *Deleted

## 2022-09-16 ENCOUNTER — Other Ambulatory Visit: Payer: Self-pay | Admitting: Family Medicine

## 2022-09-17 NOTE — Telephone Encounter (Signed)
Requested Prescriptions  Pending Prescriptions Disp Refills   colchicine 0.6 MG tablet [Pharmacy Med Name: COLCHICINE 0.6MG  TABLETS] 90 tablet 2    Sig: TAKE 2 TABLETS BY MOUTH AT FIRST SIGN OF GOUT, THEN 1 TABLET IN 1 HOUR IF PAIN CONTINUES     Endocrinology:  Gout Agents - colchicine Failed - 09/16/2022  7:54 PM      Failed - ALT in normal range and within 360 days    ALT  Date Value Ref Range Status  07/31/2022 55 (H) 0 - 44 U/L Final         Failed - Valid encounter within last 12 months    Recent Outpatient Visits           1 year ago Lumbar herniated disc   [facility] [name], MD   1 year ago Spasm of muscle of lower back   [facility] Pickard, Priscille Heidelberg, MD   1 year ago Need for immunization against influenza   [facility] [name], MD   2 years ago Dyspnea on exertion   [facility] [name], MD   3 years ago Fatty liver disease, nonalcoholic   [facility] Pickard, Priscille Heidelberg, MD              Passed - Cr in normal range and within 360 days    Creat  Date Value Ref Range Status  03/08/2022 1.04 0.60 - 1.29 mg/dL Final   Creatinine, Ser  Date Value Ref Range Status  07/31/2022 0.97 0.61 - 1.24 mg/dL Final         Passed - AST in normal range and within 360 days    AST  Date Value Ref Range Status  07/31/2022 26 15 - 41 U/L Final         Passed - CBC within normal limits and completed in the last 12 months    WBC  Date Value Ref Range Status  07/31/2022 12.5 (H) 4.0 - 10.5 K/uL Final   RBC  Date Value Ref Range Status  07/31/2022 4.96 4.22 - 5.81 MIL/uL Final   Hemoglobin  Date Value Ref Range Status  07/31/2022 15.4 13.0 - 17.0 g/dL Final   HCT  Date Value Ref Range Status  07/31/2022 45.5 39.0 - 52.0 % Final   MCHC  Date Value Ref Range Status  07/31/2022 33.8 30.0 - 36.0 g/dL Final   [facility]  Date Value Ref Range Status  07/31/2022  31.0 26.0 - 34.0 pg Final   MCV  Date Value Ref Range Status  07/31/2022 91.7 80.0 - 100.0 fL Final   No results found for: "PLTCOUNTKUC", "LABPLAT", "POCPLA" RDW  Date Value Ref Range Status  07/31/2022 12.9 11.5 - 15.5 % Final

## 2022-09-20 ENCOUNTER — Ambulatory Visit (INDEPENDENT_AMBULATORY_CARE_PROVIDER_SITE_OTHER): Payer: BC Managed Care – PPO | Admitting: Gastroenterology

## 2022-09-20 ENCOUNTER — Encounter: Payer: Self-pay | Admitting: *Deleted

## 2022-09-20 ENCOUNTER — Encounter (INDEPENDENT_AMBULATORY_CARE_PROVIDER_SITE_OTHER): Payer: Self-pay | Admitting: Gastroenterology

## 2022-09-20 ENCOUNTER — Telehealth: Payer: Self-pay | Admitting: *Deleted

## 2022-09-20 VITALS — BP 127/85 | HR 94 | Temp 97.8°F | Ht 76.0 in | Wt 299.9 lb

## 2022-09-20 DIAGNOSIS — K219 Gastro-esophageal reflux disease without esophagitis: Secondary | ICD-10-CM

## 2022-09-20 DIAGNOSIS — Z1211 Encounter for screening for malignant neoplasm of colon: Secondary | ICD-10-CM | POA: Diagnosis not present

## 2022-09-20 DIAGNOSIS — Z1212 Encounter for screening for malignant neoplasm of rectum: Secondary | ICD-10-CM

## 2022-09-20 NOTE — H&P (View-Only) (Signed)
[name], M.D. Gastroenterology & Hepatology [facility] Gastroenterology [address] [name], [place] [address] Primary Care Physician: [name], MD [address] [place] [address]  Referring MD: PCP  Chief Complaint: Positive Cologuard and GERD  History of Present Illness: MIO HOLAN is a 47 y.o. male with PMH GERD, HLD, who presents for evaluation of positive Cologuard and GERD.  The patient denies having any nausea, vomiting, fever, chills, hematochezia, melena, hematemesis, abdominal distention, abdominal pain, diarrhea, jaundice, pruritus or weight loss.  Patient had a positive Cologuard on 04/04/2022.  Patient reports having heartburn since he was a teenager. Currently takes omeprazole 40 mg qday which control his heartburn,  but occasionally may have heartburn when eating heavily at night or eating spicy meals.  Last AOZ:HYQMV Last Colonoscopy:never  FHx: neg for any gastrointestinal/liver disease, grandfather possible pancreatic cancer, grandfather esophageal cancer, mother had multiple polyps Social: quit smoking in June - used to smoke a pack once a week, chews tobacco, drinks 2-3 times a week, no illicit drug use Surgical: no abdominal surgeries  Past Medical History: Past Medical History:  Diagnosis Date   Avascular necrosis of femur head, right (HCC)    GERD (gastroesophageal reflux disease)    H/O hiatal hernia    Hyperlipidemia    Smoker     Past Surgical History: Past Surgical History:  Procedure Laterality Date   ANTERIOR CERVICAL DECOMP/DISCECTOMY FUSION  08/20/2011   Procedure: ANTERIOR CERVICAL DECOMPRESSION/DISCECTOMY FUSION 2 LEVELS;  Surgeon: [name], MD;  Location: [facility];  Service: Neurosurgery;  Laterality: Bilateral;  Cervical five-six, six-seven anterior cervical discectomy with fusion   EYE SURGERY     lasic     SPINE SURGERY     TONSILLECTOMY      Family  History: Family History  Problem Relation Age of Onset   Cancer Other        family history    Diabetes Other        family history    Cancer Mother    Cancer Maternal Grandfather    Cancer Paternal Grandfather     Social History: Social History   Tobacco Use  Smoking Status Former   Types: Cigarettes   Start date: 07/2022  Smokeless Tobacco Current   Types: Chew  Tobacco Comments   1 pack of cigarettes every other week    Social History   Substance and Sexual Activity  Alcohol Use Yes   Comment: twice weekly, usually on weekends   Social History   Substance and Sexual Activity  Drug Use No    Allergies: No Known Allergies  Medications: Current Outpatient Medications  Medication Sig Dispense Refill   allopurinol (ZYLOPRIM) 300 MG tablet TAKE 1 TABLET(300 MG) BY MOUTH DAILY 90 tablet 3   colchicine 0.6 MG tablet TAKE 2 TABLETS BY MOUTH AT FIRST SIGN OF GOUT, THEN 1 TABLET IN 1 HOUR IF PAIN CONTINUES 90 tablet 0   omeprazole (PRILOSEC) 40 MG capsule TAKE 1 CAPSULE(40 MG) BY MOUTH DAILY 90 capsule 3   pravastatin (PRAVACHOL) 20 MG tablet TAKE 1 TABLET(20 MG) BY MOUTH DAILY 90 tablet 3   sildenafil (VIAGRA) 100 MG tablet Take 0.5-1 tablets (50-100 mg total) by mouth daily as needed for erectile dysfunction. 5 tablet 11   No current facility-administered medications for this visit.    Review of Systems: GENERAL: negative for malaise, night sweats HEENT: No changes in hearing or vision, no nose bleeds  or other nasal problems. NECK: Negative for lumps, goiter, pain and significant neck swelling RESPIRATORY: Negative for cough, wheezing CARDIOVASCULAR: Negative for chest pain, leg swelling, palpitations, orthopnea GI: SEE HPI MUSCULOSKELETAL: Negative for joint pain or swelling, back pain, and muscle pain. SKIN: Negative for lesions, rash PSYCH: Negative for sleep disturbance, mood disorder and recent psychosocial stressors. HEMATOLOGY Negative for prolonged  bleeding, bruising easily, and swollen nodes. ENDOCRINE: Negative for cold or heat intolerance, polyuria, polydipsia and goiter. NEURO: negative for tremor, gait imbalance, syncope and seizures. The remainder of the review of systems is noncontributory.   Physical Exam: BP 127/85 (BP Location: Left Arm, Patient Position: Sitting, Cuff Size: Normal)   Pulse 94   Temp 97.8 F (36.6 C) (Temporal)   Ht 6\' 4"  (1.93 m)   Wt 299 lb 14.4 oz (136 kg)   BMI 36.50 kg/m  GENERAL: The patient is AO x3, in no acute distress. HEENT: Head is normocephalic and atraumatic. EOMI are intact. Mouth is well hydrated and without lesions. NECK: Supple. No masses LUNGS: Clear to auscultation. No presence of rhonchi/wheezing/rales. Adequate chest expansion HEART: RRR, normal s1 and s2. ABDOMEN: Soft, nontender, no guarding, no peritoneal signs, and nondistended. BS +. No masses. EXTREMITIES: Without any cyanosis, clubbing, rash, lesions or edema. NEUROLOGIC: AOx3, no focal motor deficit. SKIN: no jaundice, no rashes   Imaging/Labs: as above  I personally reviewed and interpreted the available labs, imaging and endoscopic files.  Impression and Plan: [name] is a 47 y.o. male with PMH GERD, HLD, who presents for evaluation of positive Cologuard and GERD.  The patient has presented a longstanding history of GERD for multiple years which has been controlled with his current dose of omeprazole.  He should avoid eating triggering foods that lead to symptom recurrence, besides taking omeprazole on a regular basis.  We also discussed that given his history of smoking, longstanding reflux disease, family history of esophageal cancer and BMI, we should proceed with an EGD for Barrett's screening which she is agreeable with.  Patient does not have any high risk factors for colorectal cancer malignancy.  He has been asymptomatic. Discussed cologuard test results in detail, specifically what it means when the test  is positive or negative.  Discussed that there is a possibility that even when the test is positive there may not be a polyp found on colonoscopy. More than 50% of the office visit was dedicated to discussing the procedure, including the day of and risks involved. Patient understands what the procedure involves including the benefits and any risks. Patient understands alternatives to the proposed procedure. Risks including (but not limited to) bleeding, tearing of the lining (perforation), rupture of adjacent organs, problems with heart and lung function, infection, and medication reactions. A small percentage of complications may require surgery, hospitalization, repeat endoscopic procedure, and/or transfusion. A small percentage of polyps and other tumors may not be seen.  - Schedule EGD and colonoscopy - Continue omeprazole 40 mg qday - Patient should take medication in the morning 30-45 minutes before eating breakfast. Discussed avoidance of eating within 2 hours of lying down to sleep and benefit of blocks to elevate head of bed. Also, will benefit from avoiding carbonated drinks/sodas or food that has tomatoes, spicy or greasy food.  All questions were answered.      [name], MD Gastroenterology and Hepatology [facility] Gastroenterology

## 2022-09-20 NOTE — Patient Instructions (Addendum)
Schedule EGD and colonoscopy Continue omeprazole 40 mg qday Patient should take medication in the morning 30-45 minutes before eating breakfast. Discussed avoidance of eating within 2 hours of lying down to sleep and benefit of blocks to elevate head of bed. Also, will benefit from avoiding carbonated drinks/sodas or food that has tomatoes, spicy or greasy food.

## 2022-09-20 NOTE — Addendum Note (Signed)
Addended by: Dolores Frame on: 09/20/2022 11:48 AM   Modules accepted: Level of Service

## 2022-09-20 NOTE — Progress Notes (Signed)
Sergio Gonzalez, M.D. Gastroenterology & Hepatology [facility] Gastroenterology [address] [name], [place] [address] Primary Care Physician: [name], MD [address] [place] [address]  Referring MD: PCP  Chief Complaint: Positive Cologuard and GERD  History of Present Illness: Sergio Gonzalez is a 47 y.o. male with PMH GERD, HLD, who presents for evaluation of positive Cologuard and GERD.  The patient denies having any nausea, vomiting, fever, chills, hematochezia, melena, hematemesis, abdominal distention, abdominal pain, diarrhea, jaundice, pruritus or weight loss.  Patient had a positive Cologuard on 04/04/2022.  Patient reports having heartburn since he was a teenager. Currently takes omeprazole 40 mg qday which control his heartburn,  but occasionally may have heartburn when eating heavily at night or eating spicy meals.  Last AOZ:HYQMV Last Colonoscopy:never  FHx: neg for any gastrointestinal/liver disease, grandfather possible pancreatic cancer, grandfather esophageal cancer, mother had multiple polyps Social: quit smoking in June - used to smoke a pack once a week, chews tobacco, drinks 2-3 times a week, no illicit drug use Surgical: no abdominal surgeries  Past Medical History: Past Medical History:  Diagnosis Date   Avascular necrosis of femur head, right (HCC)    GERD (gastroesophageal reflux disease)    H/O hiatal hernia    Hyperlipidemia    Smoker     Past Surgical History: Past Surgical History:  Procedure Laterality Date   ANTERIOR CERVICAL DECOMP/DISCECTOMY FUSION  08/20/2011   Procedure: ANTERIOR CERVICAL DECOMPRESSION/DISCECTOMY FUSION 2 LEVELS;  Surgeon: Sergio Meeker, MD;  Location: [facility];  Service: Neurosurgery;  Laterality: Bilateral;  Cervical five-six, six-seven anterior cervical discectomy with fusion   EYE SURGERY     lasic     SPINE SURGERY     TONSILLECTOMY      Family  History: Family History  Problem Relation Age of Onset   Cancer Other        family history    Diabetes Other        family history    Cancer Mother    Cancer Maternal Grandfather    Cancer Paternal Grandfather     Social History: Social History   Tobacco Use  Smoking Status Former   Types: Cigarettes   Start date: 07/2022  Smokeless Tobacco Current   Types: Chew  Tobacco Comments   1 pack of cigarettes every other week    Social History   Substance and Sexual Activity  Alcohol Use Yes   Comment: twice weekly, usually on weekends   Social History   Substance and Sexual Activity  Drug Use No    Allergies: No Known Allergies  Medications: Current Outpatient Medications  Medication Sig Dispense Refill   allopurinol (ZYLOPRIM) 300 MG tablet TAKE 1 TABLET(300 MG) BY MOUTH DAILY 90 tablet 3   colchicine 0.6 MG tablet TAKE 2 TABLETS BY MOUTH AT FIRST SIGN OF GOUT, THEN 1 TABLET IN 1 HOUR IF PAIN CONTINUES 90 tablet 0   omeprazole (PRILOSEC) 40 MG capsule TAKE 1 CAPSULE(40 MG) BY MOUTH DAILY 90 capsule 3   pravastatin (PRAVACHOL) 20 MG tablet TAKE 1 TABLET(20 MG) BY MOUTH DAILY 90 tablet 3   sildenafil (VIAGRA) 100 MG tablet Take 0.5-1 tablets (50-100 mg total) by mouth daily as needed for erectile dysfunction. 5 tablet 11   No current facility-administered medications for this visit.    Review of Systems: GENERAL: negative for malaise, night sweats HEENT: No changes in hearing or vision, no nose bleeds  or other nasal problems. NECK: Negative for lumps, goiter, pain and significant neck swelling RESPIRATORY: Negative for cough, wheezing CARDIOVASCULAR: Negative for chest pain, leg swelling, palpitations, orthopnea GI: SEE HPI MUSCULOSKELETAL: Negative for joint pain or swelling, back pain, and muscle pain. SKIN: Negative for lesions, rash PSYCH: Negative for sleep disturbance, mood disorder and recent psychosocial stressors. HEMATOLOGY Negative for prolonged  bleeding, bruising easily, and swollen nodes. ENDOCRINE: Negative for cold or heat intolerance, polyuria, polydipsia and goiter. NEURO: negative for tremor, gait imbalance, syncope and seizures. The remainder of the review of systems is noncontributory.   Physical Exam: BP 127/85 (BP Location: Left Arm, Patient Position: Sitting, Cuff Size: Normal)   Pulse 94   Temp 97.8 F (36.6 C) (Temporal)   Ht 6\' 4"  (1.93 m)   Wt 299 lb 14.4 oz (136 kg)   BMI 36.50 kg/m  GENERAL: The patient is AO x3, in no acute distress. HEENT: Head is normocephalic and atraumatic. EOMI are intact. Mouth is well hydrated and without lesions. NECK: Supple. No masses LUNGS: Clear to auscultation. No presence of rhonchi/wheezing/rales. Adequate chest expansion HEART: RRR, normal s1 and s2. ABDOMEN: Soft, nontender, no guarding, no peritoneal signs, and nondistended. BS +. No masses. EXTREMITIES: Without any cyanosis, clubbing, rash, lesions or edema. NEUROLOGIC: AOx3, no focal motor deficit. SKIN: no jaundice, no rashes   Imaging/Labs: as above  I personally reviewed and interpreted the available labs, imaging and endoscopic files.  Impression and Plan: Sergio Gonzalez is a 47 y.o. male with PMH GERD, HLD, who presents for evaluation of positive Cologuard and GERD.  The patient has presented a longstanding history of GERD for multiple years which has been controlled with his current dose of omeprazole.  He should avoid eating triggering foods that lead to symptom recurrence, besides taking omeprazole on a regular basis.  We also discussed that given his history of smoking, longstanding reflux disease, family history of esophageal cancer and BMI, we should proceed with an EGD for Barrett's screening which she is agreeable with.  Patient does not have any high risk factors for colorectal cancer malignancy.  He has been asymptomatic. Discussed cologuard test results in detail, specifically what it means when the test  is positive or negative.  Discussed that there is a possibility that even when the test is positive there may not be a polyp found on colonoscopy. More than 50% of the office visit was dedicated to discussing the procedure, including the day of and risks involved. Patient understands what the procedure involves including the benefits and any risks. Patient understands alternatives to the proposed procedure. Risks including (but not limited to) bleeding, tearing of the lining (perforation), rupture of adjacent organs, problems with heart and lung function, infection, and medication reactions. A small percentage of complications may require surgery, hospitalization, repeat endoscopic procedure, and/or transfusion. A small percentage of polyps and other tumors may not be seen.  - Schedule EGD and colonoscopy - Continue omeprazole 40 mg qday - Patient should take medication in the morning 30-45 minutes before eating breakfast. Discussed avoidance of eating within 2 hours of lying down to sleep and benefit of blocks to elevate head of bed. Also, will benefit from avoiding carbonated drinks/sodas or food that has tomatoes, spicy or greasy food.  All questions were answered.      Sergio Blazing, MD Gastroenterology and Hepatology [facility] Gastroenterology

## 2022-09-20 NOTE — Telephone Encounter (Signed)
[employer] PA: for colonoscopy: The member does not have SOC coverage. The procedure cannot be added. For EGD: Order ID: [id number]       Authorized  Approval Valid Through: 09/20/2022 - 11/18/2022

## 2022-09-21 ENCOUNTER — Telehealth (INDEPENDENT_AMBULATORY_CARE_PROVIDER_SITE_OTHER): Payer: Self-pay | Admitting: Gastroenterology

## 2022-09-21 NOTE — Telephone Encounter (Signed)
Pt left voicemail needing to reschedule TCS/EGD for 10/08/22 if possible. Pt originally on for 10/06/22-pt has been changed to 10/08/22 at 10:30 am. Updated instructions mailed to patient.

## 2022-09-30 ENCOUNTER — Ambulatory Visit (HOSPITAL_COMMUNITY)
Admission: RE | Admit: 2022-09-30 | Discharge: 2022-09-30 | Disposition: A | Discharge status: Home / Self Care | Payer: BC Managed Care – PPO | Source: Ambulatory Visit | Attending: Family Medicine | Admitting: Family Medicine

## 2022-09-30 DIAGNOSIS — E7800 Pure hypercholesterolemia, unspecified: Secondary | ICD-10-CM

## 2022-10-04 ENCOUNTER — Other Ambulatory Visit: Payer: Self-pay | Admitting: Family Medicine

## 2022-10-06 ENCOUNTER — Other Ambulatory Visit: Payer: Self-pay | Admitting: Family Medicine

## 2022-10-06 NOTE — Telephone Encounter (Signed)
Last OV 08/17/22 Requested Prescriptions  Pending Prescriptions Disp Refills   sildenafil (VIAGRA) 100 MG tablet [Pharmacy Med Name: SILDENAFIL 100MG  TABLETS] 5 tablet 11    Sig: TAKE 1/2 TO 1 TABLET(50 TO 100 MG) BY MOUTH DAILY AS NEEDED FOR ERECTILE DYSFUNCTION     Urology: Erectile Dysfunction Agents Failed - 10/04/2022  7:12 PM      Failed - ALT in normal range and within 360 days    ALT  Date Value Ref Range Status  07/31/2022 55 (H) 0 - 44 U/L Final         Failed - Valid encounter within last 12 months    Recent Outpatient Visits           1 year ago Lumbar herniated disc   [facility] [name], MD   1 year ago Spasm of muscle of lower back   [facility] Pickard, Priscille Heidelberg, MD   1 year ago Need for immunization against influenza   [facility] [name], MD   3 years ago Dyspnea on exertion   [facility] [name], MD   4 years ago Fatty liver disease, nonalcoholic   [facility] Pickard, Priscille Heidelberg, MD              Passed - AST in normal range and within 360 days    AST  Date Value Ref Range Status  07/31/2022 26 15 - 41 U/L Final         Passed - Last BP in normal range    BP Readings from Last 1 Encounters:  09/20/22 127/85

## 2022-10-07 ENCOUNTER — Encounter: Payer: Self-pay | Admitting: Family Medicine

## 2022-10-07 DIAGNOSIS — I7121 Aneurysm of the ascending aorta, without rupture: Secondary | ICD-10-CM | POA: Insufficient documentation

## 2022-10-07 NOTE — Telephone Encounter (Signed)
Last OV 08/17/22 within protocol.  Requested Prescriptions  Pending Prescriptions Disp Refills   allopurinol (ZYLOPRIM) 300 MG tablet [Pharmacy Med Name: ALLOPURINOL 300MG  TABLETS] 90 tablet 0    Sig: TAKE 1 TABLET(300 MG) BY MOUTH DAILY     Endocrinology:  Gout Agents - allopurinol Failed - 10/06/2022 10:16 AM      Failed - Uric Acid in normal range and within 360 days    Uric Acid, Serum  Date Value Ref Range Status  09/21/2018 9.9 (H) 4.0 - 8.0 mg/dL Final    Comment:    Therapeutic target for gout patients: <6.0 mg/dL .          Failed - Valid encounter within last 12 months    Recent Outpatient Visits           1 year ago Lumbar herniated disc   [facility] Tanya Nones, Priscille Heidelberg, MD   1 year ago Spasm of muscle of lower back   [facility] Pickard, Priscille Heidelberg, MD   1 year ago Need for immunization against influenza   [facility] [name], MD   3 years ago Dyspnea on exertion   [facility] [name], MD   4 years ago Fatty liver disease, nonalcoholic   [facility] Pickard, Priscille Heidelberg, MD              Passed - Cr in normal range and within 360 days    Creat  Date Value Ref Range Status  03/08/2022 1.04 0.60 - 1.29 mg/dL Final   Creatinine, Ser  Date Value Ref Range Status  07/31/2022 0.97 0.61 - 1.24 mg/dL Final         Passed - CBC within normal limits and completed in the last 12 months    WBC  Date Value Ref Range Status  07/31/2022 12.5 (H) 4.0 - 10.5 K/uL Final   RBC  Date Value Ref Range Status  07/31/2022 4.96 4.22 - 5.81 MIL/uL Final   Hemoglobin  Date Value Ref Range Status  07/31/2022 15.4 13.0 - 17.0 g/dL Final   HCT  Date Value Ref Range Status  07/31/2022 45.5 39.0 - 52.0 % Final   MCHC  Date Value Ref Range Status  07/31/2022 33.8 30.0 - 36.0 g/dL Final   [facility]  Date Value Ref Range Status  07/31/2022 31.0 26.0 - 34.0 pg Final   MCV  Date  Value Ref Range Status  07/31/2022 91.7 80.0 - 100.0 fL Final   No results found for: "PLTCOUNTKUC", "LABPLAT", "POCPLA" RDW  Date Value Ref Range Status  07/31/2022 12.9 11.5 - 15.5 % Final

## 2022-10-08 ENCOUNTER — Encounter (HOSPITAL_COMMUNITY)
Admission: RE | Disposition: A | Discharge status: Home / Self Care | Payer: Self-pay | Source: Home / Self Care | Attending: Gastroenterology

## 2022-10-08 ENCOUNTER — Ambulatory Visit (HOSPITAL_COMMUNITY)
Admission: RE | Admit: 2022-10-08 | Discharge: 2022-10-08 | Disposition: A | Discharge status: Home / Self Care | Payer: BC Managed Care – PPO | Attending: Gastroenterology | Admitting: Gastroenterology

## 2022-10-08 ENCOUNTER — Ambulatory Visit (HOSPITAL_COMMUNITY): Payer: BC Managed Care – PPO | Admitting: Anesthesiology

## 2022-10-08 ENCOUNTER — Encounter (HOSPITAL_COMMUNITY): Payer: Self-pay | Admitting: Gastroenterology

## 2022-10-08 ENCOUNTER — Other Ambulatory Visit: Payer: Self-pay

## 2022-10-08 DIAGNOSIS — D120 Benign neoplasm of cecum: Secondary | ICD-10-CM | POA: Diagnosis not present

## 2022-10-08 DIAGNOSIS — Z87891 Personal history of nicotine dependence: Secondary | ICD-10-CM | POA: Diagnosis not present

## 2022-10-08 DIAGNOSIS — K2270 Barrett's esophagus without dysplasia: Secondary | ICD-10-CM

## 2022-10-08 DIAGNOSIS — Z79899 Other long term (current) drug therapy: Secondary | ICD-10-CM | POA: Insufficient documentation

## 2022-10-08 DIAGNOSIS — K449 Diaphragmatic hernia without obstruction or gangrene: Secondary | ICD-10-CM | POA: Diagnosis not present

## 2022-10-08 DIAGNOSIS — Z13810 Encounter for screening for upper gastrointestinal disorder: Secondary | ICD-10-CM | POA: Insufficient documentation

## 2022-10-08 DIAGNOSIS — D123 Benign neoplasm of transverse colon: Secondary | ICD-10-CM | POA: Insufficient documentation

## 2022-10-08 DIAGNOSIS — K219 Gastro-esophageal reflux disease without esophagitis: Secondary | ICD-10-CM | POA: Diagnosis not present

## 2022-10-08 DIAGNOSIS — K648 Other hemorrhoids: Secondary | ICD-10-CM | POA: Diagnosis not present

## 2022-10-08 DIAGNOSIS — D122 Benign neoplasm of ascending colon: Secondary | ICD-10-CM | POA: Diagnosis not present

## 2022-10-08 DIAGNOSIS — R195 Other fecal abnormalities: Secondary | ICD-10-CM | POA: Insufficient documentation

## 2022-10-08 DIAGNOSIS — K635 Polyp of colon: Secondary | ICD-10-CM | POA: Diagnosis not present

## 2022-10-08 DIAGNOSIS — Z1211 Encounter for screening for malignant neoplasm of colon: Secondary | ICD-10-CM | POA: Diagnosis not present

## 2022-10-08 DIAGNOSIS — Z800 Family history of malignant neoplasm of digestive organs: Secondary | ICD-10-CM | POA: Insufficient documentation

## 2022-10-08 DIAGNOSIS — E785 Hyperlipidemia, unspecified: Secondary | ICD-10-CM | POA: Diagnosis not present

## 2022-10-08 DIAGNOSIS — K2289 Other specified disease of esophagus: Secondary | ICD-10-CM | POA: Diagnosis not present

## 2022-10-08 HISTORY — PX: SUBMUCOSAL LIFTING INJECTION: SHX6855

## 2022-10-08 HISTORY — PX: BIOPSY: SHX5522

## 2022-10-08 HISTORY — PX: HEMOSTASIS CLIP PLACEMENT: SHX6857

## 2022-10-08 HISTORY — PX: POLYPECTOMY: SHX149

## 2022-10-08 HISTORY — PX: COLONOSCOPY WITH PROPOFOL: SHX5780

## 2022-10-08 HISTORY — PX: ESOPHAGOGASTRODUODENOSCOPY (EGD) WITH PROPOFOL: SHX5813

## 2022-10-08 LAB — HM COLONOSCOPY

## 2022-10-08 SURGERY — COLONOSCOPY WITH PROPOFOL
Anesthesia: General

## 2022-10-08 MED ORDER — MIDAZOLAM HCL 2 MG/2ML IJ SOLN
INTRAMUSCULAR | Status: AC
  Filled 2022-10-08: qty 2

## 2022-10-08 MED ORDER — LIDOCAINE HCL (CARDIAC) PF 100 MG/5ML IV SOSY
PREFILLED_SYRINGE | INTRAVENOUS | Status: DC | PRN
Start: 2022-10-08 — End: 2022-10-08
  Administered 2022-10-08: 100 mg via INTRATRACHEAL

## 2022-10-08 MED ORDER — GLYCOPYRROLATE PF 0.2 MG/ML IJ SOSY
PREFILLED_SYRINGE | INTRAMUSCULAR | Status: DC | PRN
  Administered 2022-10-08: .2 mg via INTRAVENOUS

## 2022-10-08 MED ORDER — PROPOFOL 500 MG/50ML IV EMUL
INTRAVENOUS | Status: DC | PRN
  Administered 2022-10-08: 100 ug/kg/min via INTRAVENOUS

## 2022-10-08 MED ORDER — KETAMINE HCL 50 MG/5ML IJ SOSY
PREFILLED_SYRINGE | INTRAMUSCULAR | Status: AC
  Filled 2022-10-08: qty 5

## 2022-10-08 MED ORDER — PROPOFOL 10 MG/ML IV BOLUS
INTRAVENOUS | Status: DC | PRN
  Administered 2022-10-08: 30 mg via INTRAVENOUS
  Administered 2022-10-08: 70 mg via INTRAVENOUS
  Administered 2022-10-08 (×2): 30 mg via INTRAVENOUS

## 2022-10-08 MED ORDER — PHENYLEPHRINE HCL (PRESSORS) 10 MG/ML IV SOLN
INTRAVENOUS | Status: DC | PRN
Start: 2022-10-08 — End: 2022-10-08
  Administered 2022-10-08 (×5): 80 ug via INTRAVENOUS
  Administered 2022-10-08 (×2): 160 ug via INTRAVENOUS

## 2022-10-08 MED ORDER — LACTATED RINGERS IV SOLN: INTRAVENOUS | Status: DC

## 2022-10-08 MED ORDER — KETAMINE HCL 10 MG/ML IJ SOLN
INTRAMUSCULAR | Status: DC | PRN
Start: 2022-10-08 — End: 2022-10-08
  Administered 2022-10-08 (×2): 25 mg via INTRAVENOUS

## 2022-10-08 MED ORDER — MIDAZOLAM HCL 5 MG/5ML IJ SOLN
INTRAMUSCULAR | Status: DC | PRN
Start: 2022-10-08 — End: 2022-10-08
  Administered 2022-10-08: 2 mg via INTRAVENOUS

## 2022-10-08 NOTE — Discharge Instructions (Addendum)
You are being discharged to home.  Resume your previous diet.  We are waiting for your pathology results.  Continue your present medications.  Your physician has recommended a repeat colonoscopy in three years for surveillance.

## 2022-10-08 NOTE — Transfer of Care (Signed)
Immediate Anesthesia Transfer of Care Note  Patient: Sergio Gonzalez  Procedure(s) Performed: COLONOSCOPY WITH PROPOFOL ESOPHAGOGASTRODUODENOSCOPY (EGD) WITH PROPOFOL BIOPSY SUBMUCOSAL LIFTING INJECTION POLYPECTOMY INTESTINAL HEMOSTASIS CLIP PLACEMENT  Patient Location: PACU  Anesthesia Type:General  Level of Consciousness: sedated  Airway & Oxygen Therapy: Patient Spontanous Breathing  Post-op Assessment: Report given to RN and Post -op Vital signs reviewed and stable  Post vital signs: Reviewed and stable  Last Vitals:  Vitals Value Taken Time  BP 96/59 10/08/22 1216  Temp 36.8 C 10/08/22 1216  Pulse 77 10/08/22 1216  Resp 21 10/08/22 1216  SpO2 96 % 10/08/22 1216    Last Pain:  Vitals:   10/08/22 1216  TempSrc: Axillary  PainSc: 0-No pain      Patients Stated Pain Goal: 8 (10/08/22 0918)  Complications: No notable events documented.

## 2022-10-08 NOTE — Anesthesia Preprocedure Evaluation (Addendum)
Anesthesia Evaluation  Patient identified by MRN, date of birth, ID band Patient awake    Reviewed: Allergy & Precautions, H&P , NPO status , Patient's Chart, lab work & pertinent test results  Airway Mallampati: III  TM Distance: >3 FB Neck ROM: Full  Mouth opening: Limited Mouth Opening  Dental no notable dental hx. (+) Dental Advisory Given, Teeth Intact   Pulmonary sleep apnea (snoring??) , former smoker   Pulmonary exam normal breath sounds clear to auscultation       Cardiovascular negative cardio ROS Normal cardiovascular exam Rhythm:Regular Rate:Normal     Neuro/Psych negative neurological ROS  negative psych ROS   GI/Hepatic Neg liver ROS, hiatal hernia,GERD  Medicated and Controlled,,  Endo/Other  negative endocrine ROS    Renal/GU negative Renal ROS  negative genitourinary   Musculoskeletal negative musculoskeletal ROS (+)    Abdominal   Peds negative pediatric ROS (+)  Hematology negative hematology ROS (+)   Anesthesia Other Findings   Reproductive/Obstetrics negative OB ROS                             Anesthesia Physical Anesthesia Plan  ASA: 2  Anesthesia Plan: General   Post-op Pain Management: Minimal or no pain anticipated   Induction: Intravenous  PONV Risk Score and Plan: 1 and Propofol infusion  Airway Management Planned: Nasal Cannula and Natural Airway  Additional Equipment:   Intra-op Plan:   Post-operative Plan:   Informed Consent: I have reviewed the patients History and Physical, chart, labs and discussed the procedure including the risks, benefits and alternatives for the proposed anesthesia with the patient or authorized representative who has indicated his/her understanding and acceptance.     Dental advisory given  Plan Discussed with: CRNA and Surgeon  Anesthesia Plan Comments:        Anesthesia Quick Evaluation

## 2022-10-08 NOTE — Anesthesia Postprocedure Evaluation (Signed)
Anesthesia Post Note  Patient: AUSTUN DEBAERE  Procedure(s) Performed: COLONOSCOPY WITH PROPOFOL ESOPHAGOGASTRODUODENOSCOPY (EGD) WITH PROPOFOL BIOPSY SUBMUCOSAL LIFTING INJECTION POLYPECTOMY INTESTINAL HEMOSTASIS CLIP PLACEMENT  Patient location during evaluation: Phase II Anesthesia Type: General Level of consciousness: awake and alert and oriented Pain management: pain level controlled Vital Signs Assessment: post-procedure vital signs reviewed and stable Respiratory status: spontaneous breathing, nonlabored ventilation and respiratory function stable Cardiovascular status: blood pressure returned to baseline and stable Postop Assessment: no apparent nausea or vomiting Anesthetic complications: no  No notable events documented.   Last Vitals:  Vitals:   10/08/22 0918 10/08/22 1216  BP: 135/88 (!) 96/59  Pulse: 81 77  Resp: 19 (!) 21  Temp: 36.6 C 36.8 C  SpO2: 97% 96%    Last Pain:  Vitals:   10/08/22 1216  TempSrc: Axillary  PainSc: 0-No pain                 Kaevion Sinclair C Mkenzie Dotts

## 2022-10-08 NOTE — Op Note (Signed)
[facility] Patient Name: Sergio Gonzalez Procedure Date: 10/08/2022 10:50 AM MRN: [id number] Date of Birth: Jun 27, 1975 Attending MD: [name] , , [id number] CSN: [id number] Age: 47 Admit Type: Outpatient Procedure:                Colonoscopy Indications:              Positive Cologuard test Providers:                [name], Crystal Page, Pandora Leiter,                            Technician Referring MD:              Medicines:                Monitored Anesthesia Care Complications:            No immediate complications. Estimated Blood Loss:     Estimated blood loss: none. Procedure:                Pre-Anesthesia Assessment:                           - Prior to the procedure, a History and Physical                            was performed, and patient medications, allergies                            and sensitivities were reviewed. The patient's                            tolerance of previous anesthesia was reviewed.                           - The risks and benefits of the procedure and the                            sedation options and risks were discussed with the                            patient. All questions were answered and informed                            consent was obtained.                           - ASA Grade Assessment: II - A patient with mild                            systemic disease.                           After obtaining informed consent, the colonoscope                            was passed under direct vision. Throughout the  procedure, the patient's blood pressure, pulse, and                            oxygen saturations were monitored continuously. The                            [id number]-HQ190L ([id number]) scope was introduced through                            the anus and advanced to the the cecum, identified                            by appendiceal orifice and ileocecal valve. The                             colonoscopy was performed without difficulty. The                            patient tolerated the procedure well. The quality                            of the bowel preparation was good. Scope In: 11:18:11 AM Scope Out: 12:12:26 PM Scope Withdrawal Time: 0 hours 34 minutes 17 seconds  Total Procedure Duration: 0 hours 54 minutes 15 seconds  Findings:      The perianal and digital rectal examinations were normal.      Three sessile polyps were found in the ascending colon and cecum. The       polyps were 2 to 10 mm in size. These polyps were removed with a cold       snare. Resection and retrieval were complete.      A 12 to 15 mm polyp was found in the proximal ascending colon. The polyp       was semi-sessile. The polyp was removed with a hot snare. Resection and       retrieval were complete. To prevent bleeding after the polypectomy, one       UItra hemostatic clip was successfully placed. Clip manufacturer: [employer]. There was no bleeding at the end of the procedure.      A 15 mm polyp was found in the transverse colon. The polyp was       semi-sessile. Area was successfully injected with 1 mL Eleview for a       lift polypectomy. Imaging was performed using white light and narrow       band imaging to visualize the mucosa and demarcate the polyp site after       injection for EMR purposes. The polyp was removed with a hot snare.       Resection and retrieval were complete. To prevent bleeding after the       polypectomy, two hemostatic clips were successfully placed. Clip       manufacturer: [employer]. There was no bleeding at the end of the       procedure.      Two sessile polyps were found in the transverse colon. The polyps were 3       to 5 mm in size. These polyps were removed with a cold  snare. Resection       and retrieval were complete.      Non-bleeding internal hemorrhoids were found during retroflexion. The       hemorrhoids were  small. Impression:               - Three 2 to 10 mm polyps in the ascending colon                            and in the cecum, removed with a cold snare.                            Resected and retrieved.                           - One 12 to 15 mm polyp in the proximal ascending                            colon, removed with a hot snare. Resected and                            retrieved. Clip was placed. Clip manufacturer:                            [employer].                           - One 15 mm polyp in the transverse colon, removed                            with a hot snare. Resected and retrieved via EMR.                            Clips were placed. Clip manufacturer: [employer].                           - Two 3 to 5 mm polyps in the transverse colon,                            removed with a cold snare. Resected and retrieved.                           - Non-bleeding internal hemorrhoids. Moderate Sedation:      Per Anesthesia Care Recommendation:           - Discharge patient to home (ambulatory).                           - Resume previous diet.                           - Await pathology results.                           -  Repeat colonoscopy in 3 years for surveillance. Procedure Code(s):        --- Professional ---                           [phone], Colonoscopy, flexible; with removal of                            tumor(s), polyp(s), or other lesion(s) by snare                            technique                           [id number], Colonoscopy, flexible; with directed                            submucosal injection(s), any substance Diagnosis Code(s):        --- Professional ---                           D12.2, Benign neoplasm of ascending colon                           D12.0, Benign neoplasm of cecum                           D12.3, Benign neoplasm of transverse colon (hepatic                            flexure or splenic  flexure)                           K64.8, Other hemorrhoids                           R19.5, Other fecal abnormalities CPT copyright 2022 American Medical Association. All rights reserved. The codes documented in this report are preliminary and upon coder review may  be revised to meet current compliance requirements. [name], MD [name],  10/08/2022 12:22:55 PM This report has been signed electronically. Number of Addenda: 0

## 2022-10-08 NOTE — Op Note (Signed)
[facility] Patient Name: Sergio Gonzalez Procedure Date: 10/08/2022 10:52 AM MRN: [id number] Date of Birth: 05-30-1975 Attending MD: [name] , , [id number] CSN: [id number] Age: 47 Admit Type: Outpatient Procedure:                Upper GI endoscopy Indications:              Screening for Barrett's esophagus in patient at                            risk for this condition Providers:                [name], Crystal Page, Pandora Leiter,                            Technician Referring MD:              Medicines:                Monitored Anesthesia Care Complications:            No immediate complications. Estimated Blood Loss:     Estimated blood loss: none. Procedure:                Pre-Anesthesia Assessment:                           - Prior to the procedure, a History and Physical                            was performed, and patient medications, allergies                            and sensitivities were reviewed. The patient's                            tolerance of previous anesthesia was reviewed.                           - The risks and benefits of the procedure and the                            sedation options and risks were discussed with the                            patient. All questions were answered and informed                            consent was obtained.                           - ASA Grade Assessment: II - A patient with mild                            systemic disease.                           After obtaining informed consent, the endoscope was  passed under direct vision. Throughout the                            procedure, the patient's blood pressure, pulse, and                            oxygen saturations were monitored continuously. The                            [id number]-H190 ([id number]) scope was introduced through the                            mouth, and advanced to the second part of duodenum.                             The upper GI endoscopy was accomplished without                            difficulty. The patient tolerated the procedure                            well. Scope In: 11:06:17 AM Scope Out: 11:11:13 AM Total Procedure Duration: 0 hours 4 minutes 56 seconds  Findings:      The esophagus and gastroesophageal junction were examined with white       light and narrow band imaging (NBI). There were esophageal mucosal       changes suspicious for short-segment Barrett's esophagus. These changes       involved the mucosa extending to the Z-line (39 cm from the incisors).       Squamous islands were present at 39 cm. This was biopsied with a cold       forceps for histology.      The gastroesophageal flap valve was visualized endoscopically and       classified as Hill Grade II (fold present, opens with respiration).      The stomach was normal.      The examined duodenum was normal. Impression:               - Esophageal mucosal changes suspicious for                            short-segment Barrett's esophagus. Biopsied.                           - Normal stomach.                           - Normal examined duodenum. Moderate Sedation:      Per Anesthesia Care Recommendation:           - Discharge patient to home (ambulatory).                           - Resume previous diet.                           - Await pathology results.                           -  Continue present medications. Procedure Code(s):        --- Professional ---                           [id number], Esophagogastroduodenoscopy, flexible,                            transoral; with biopsy, single or multiple Diagnosis Code(s):        --- Professional ---                           K22.89, Other specified disease of esophagus                           Z13.810, Encounter for screening for upper                            gastrointestinal disorder CPT copyright 2022 American Medical Association. All rights reserved. The  codes documented in this report are preliminary and upon coder review may  be revised to meet current compliance requirements. [name], MD [name],  10/08/2022 11:17:18 AM This report has been signed electronically. Number of Addenda: 0

## 2022-10-08 NOTE — Interval H&P Note (Signed)
History and Physical Interval Note:  10/08/2022 10:17 AM  Sergio Gonzalez  has presented today for surgery, with the diagnosis of positive cologuard,GERD,Barrett's screening.  The various methods of treatment have been discussed with the patient and family. After consideration of risks, benefits and other options for treatment, the patient has consented to  Procedure(s) with comments: COLONOSCOPY WITH PROPOFOL (N/A) - 7:30 am, asa 1 ESOPHAGOGASTRODUODENOSCOPY (EGD) WITH PROPOFOL (N/A) as a surgical intervention.  The patient's history has been reviewed, patient examined, no change in status, stable for surgery.  I have reviewed the patient's chart and labs.  Questions were answered to the patient's satisfaction.     [name] Mayorga

## 2022-10-11 ENCOUNTER — Encounter (INDEPENDENT_AMBULATORY_CARE_PROVIDER_SITE_OTHER): Payer: Self-pay | Admitting: *Deleted

## 2022-10-11 LAB — SURGICAL PATHOLOGY

## 2022-10-13 ENCOUNTER — Encounter (INDEPENDENT_AMBULATORY_CARE_PROVIDER_SITE_OTHER): Payer: Self-pay | Admitting: *Deleted

## 2022-10-27 ENCOUNTER — Encounter (HOSPITAL_COMMUNITY): Payer: Self-pay | Admitting: Gastroenterology

## 2022-11-08 ENCOUNTER — Encounter (INDEPENDENT_AMBULATORY_CARE_PROVIDER_SITE_OTHER): Payer: Self-pay | Admitting: Gastroenterology

## 2022-11-08 ENCOUNTER — Ambulatory Visit (INDEPENDENT_AMBULATORY_CARE_PROVIDER_SITE_OTHER): Payer: BC Managed Care – PPO | Admitting: Gastroenterology

## 2022-11-08 VITALS — BP 130/86 | HR 82 | Temp 97.8°F | Ht 75.0 in | Wt 296.6 lb

## 2022-11-08 DIAGNOSIS — K219 Gastro-esophageal reflux disease without esophagitis: Secondary | ICD-10-CM

## 2022-11-08 DIAGNOSIS — R109 Unspecified abdominal pain: Secondary | ICD-10-CM | POA: Diagnosis not present

## 2022-11-08 MED ORDER — OMEPRAZOLE 40 MG PO CPDR
40.0000 mg | DELAYED_RELEASE_CAPSULE | Freq: Two times a day (BID) | ORAL | 1 refills | Status: DC
Start: 2022-11-08 — End: 2022-12-06

## 2022-11-08 NOTE — Progress Notes (Unsigned)
Sergio Gonzalez, M.D. Gastroenterology & Hepatology [facility] [address] [place], [place] [address]  Primary Care Physician: [name], MD [address] [place] [address]  I will communicate my assessment and recommendations to the referring MD via EMR.  Problems: GERD  History of Present Illness: Sergio Gonzalez is a 47 y.o. male with PMH GERD, HLD, who presents for evaluation of abdominal pain and GERD.   The patient was last seen on 09/20/2022. At that time, the patient was scheduled for an EGD and colonoscopy with findings described below.  He was advised to take omeprazole every day 40 mg.  Patient reports that he has felt his heartburn was worse after his EGD, but it felt better immediately after he started taking omeprazole on a regular basis. He reports that he initially felt that after taking omeprazole his heartburn was a little better, but more recently he has felt frequent regurgitation during the night. He is having acid regurgitation 5/7 times per week. No dysphagia or odynophagia. He tried for a short term taking omeprazole twice a day, which helped with his symptoms but he went back to once a day as he did not want to run out of the medication.  He reports had some abdominal cramping in his upper abdoemn at the time of his first appointment but it eventually subsided on its own.  He is not concerned about the abdominal cramping episodes as they have resolved.  The patient denies having any nausea, vomiting, fever, chills, hematochezia, melena, hematemesis, abdominal distention, diarrhea, jaundice, pruritus.  Has lost 3 pounds since last appointment.   Last EGD:10/08/2022 Salmon colored mucosa and squamous islands at 39 cm which were biopsied, normal stomach and duodenum.  Biopsies were within normal limits.  Last Colonoscopy: 10/08/2022 - Three 2 to 10 mm polyps in the ascending colon and in the cecum,  removed with a cold snare. Resected and retrieved. - One 12 to 15 mm polyp in the proximal ascending colon, removed with a hot snare. Resected and retrieved. Clip was placed. Clip manufacturer: [employer]. - One 15 mm polyp in the transverse colon, removed with a hot snare. Resected and retrieved via EMR. Clips were placed. Clip manufacturer: [employer]. - Two 3 to 5 mm polyps in the transverse colon, removed with a cold snare. Resected and retrieved. - Non- bleeding internal hemorrhoids.  The pathology showed the samples from the esophagus. Five of the polyps were tubular adenomas   Recommended repeat colonoscopy in 3 years  Past Medical History: Past Medical History:  Diagnosis Date   Ascending aortic aneurysm (HCC)    Avascular necrosis of femur head, right (HCC)    GERD (gastroesophageal reflux disease)    H/O hiatal hernia    Hyperlipidemia    Smoker     Past Surgical History: Past Surgical History:  Procedure Laterality Date   ANTERIOR CERVICAL DECOMP/DISCECTOMY FUSION  08/20/2011   Procedure: ANTERIOR CERVICAL DECOMPRESSION/DISCECTOMY FUSION 2 LEVELS;  Surgeon: [name], MD;  [facility]: [facility];  Service: Neurosurgery;  Laterality: Bilateral;  Cervical five-six, six-seven anterior cervical discectomy with fusion   BIOPSY  10/08/2022   Procedure: BIOPSY;  Surgeon: Dolores Frame, MD;  [facility]: [facility];  Service: Gastroenterology;;   COLONOSCOPY WITH PROPOFOL N/A 10/08/2022   Procedure: COLONOSCOPY WITH PROPOFOL;  Surgeon: Dolores Frame, MD;  [facility];  Service: Gastroenterology;  Laterality: N/A;  7:30 am, asa 1  ESOPHAGOGASTRODUODENOSCOPY (EGD) WITH PROPOFOL N/A 10/08/2022   Procedure: ESOPHAGOGASTRODUODENOSCOPY (EGD) WITH PROPOFOL;  Surgeon: Dolores Frame, MD;  [facility];  Service: Gastroenterology;  Laterality: N/A;   EYE SURGERY     HEMOSTASIS CLIP PLACEMENT  10/08/2022   Procedure:  HEMOSTASIS CLIP PLACEMENT;  Surgeon: Marguerita Merles, Reuel Boom, MD;  [facility];  Service: Gastroenterology;;   lasic     POLYPECTOMY  10/08/2022   Procedure: POLYPECTOMY INTESTINAL;  Surgeon: Marguerita Merles, Reuel Boom, MD;  [facility];  Service: Gastroenterology;;  hot snare and cold snare   SPINE SURGERY     SUBMUCOSAL LIFTING INJECTION  10/08/2022   Procedure: SUBMUCOSAL LIFTING INJECTION;  Surgeon: Dolores Frame, MD;  [facility];  Service: Gastroenterology;;   TONSILLECTOMY      Family History: Family History  Problem Relation Age of Onset   Cancer Other        family history    Diabetes Other        family history    Cancer Mother    Cancer Maternal Grandfather    Cancer Paternal Grandfather     Social History: Social History   Tobacco Use  Smoking Status Former   Types: Cigarettes   Start date: 07/2022  Smokeless Tobacco Current   Types: Chew  Tobacco Comments   1 pack of cigarettes every other week    Social History   Substance and Sexual Activity  Alcohol Use Yes   Comment: twice weekly, usually on weekends   Social History   Substance and Sexual Activity  Drug Use No    Allergies: No Known Allergies  Medications: Current Outpatient Medications  Medication Sig Dispense Refill   allopurinol (ZYLOPRIM) 300 MG tablet TAKE 1 TABLET(300 MG) BY MOUTH DAILY 90 tablet 0   colchicine 0.6 MG tablet TAKE 2 TABLETS BY MOUTH AT FIRST SIGN OF GOUT, THEN 1 TABLET IN 1 HOUR IF PAIN CONTINUES 90 tablet 0   omeprazole (PRILOSEC) 40 MG capsule TAKE 1 CAPSULE(40 MG) BY MOUTH DAILY 90 capsule 3   pravastatin (PRAVACHOL) 20 MG tablet TAKE 1 TABLET(20 MG) BY MOUTH DAILY 90 tablet 3   sildenafil (VIAGRA) 100 MG tablet TAKE 1/2 TO 1 TABLET(50 TO 100 MG) BY MOUTH DAILY AS NEEDED FOR ERECTILE DYSFUNCTION 15 tablet 1   No current facility-administered medications for this visit.    Review of Systems: GENERAL: negative for  malaise, night sweats HEENT: No changes in hearing or vision, no nose bleeds or other nasal problems. NECK: Negative for lumps, goiter, pain and significant neck swelling RESPIRATORY: Negative for cough, wheezing CARDIOVASCULAR: Negative for chest pain, leg swelling, palpitations, orthopnea GI: SEE HPI MUSCULOSKELETAL: Negative for joint pain or swelling, back pain, and muscle pain. SKIN: Negative for lesions, rash PSYCH: Negative for sleep disturbance, mood disorder and recent psychosocial stressors. HEMATOLOGY Negative for prolonged bleeding, bruising easily, and swollen nodes. ENDOCRINE: Negative for cold or heat intolerance, polyuria, polydipsia and goiter. NEURO: negative for tremor, gait imbalance, syncope and seizures. The remainder of the review of systems is noncontributory.   Physical Exam: BP 130/86 (BP Location: Left Arm, Patient Position: Sitting, Cuff Size: Large)   Pulse 82   Temp 97.8 F (36.6 C) (Temporal)   Ht 6\' 3"  (1.905 m)   Wt 296 lb 9.6 oz (134.5 kg)   BMI 37.07 kg/m  GENERAL: The patient is AO x3, in no acute distress. Obese. HEENT: Head is normocephalic and atraumatic. EOMI are intact. Mouth is  well hydrated and without lesions. NECK: Supple. No masses LUNGS: Clear to auscultation. No presence of rhonchi/wheezing/rales. Adequate chest expansion HEART: RRR, normal s1 and s2. ABDOMEN: Soft, nontender, no guarding, no peritoneal signs, and nondistended. BS +. No masses. EXTREMITIES: Without any cyanosis, clubbing, rash, lesions or edema. NEUROLOGIC: AOx3, no focal motor deficit. SKIN: no jaundice, no rashes  Imaging/Labs: as above  I personally reviewed and interpreted the available labs, imaging and endoscopic files.  Impression and Plan: Sergio Gonzalez is a 47 y.o. male with PMH GERD, HLD, who presents for evaluation of abdominal pain and GERD.  The patient has presented persistent GERD symptoms, which have somewhat improved with the use of PPI on a  daily basis but have not completely resolved.  In fact, he has noticed more regurgitation recently.  We discussed in detail the possibility of increasing his PPI to twice a day dosing and the adequate fashion to take this medication.  I also advised him of the importance of working on weight loss as this may provide further improvement of his GERD.  He agreed understood this.  The patient and I held a thorough discussion about potential nonpharmacologic treatments for reflux such as transoral Incisionless Fundoplication (TIF).  A pamphlet with details regarding TIF was provided.  Patient was advised to lose weight to qualify for this procedure in the future if he is interested.  -Increase omeprazole to 40 mg twice a day. - Explained presumed etiology of reflux symptoms. Instruction provided in the use of antireflux medication - patient should take medication 30-45 minutes before eating breakfast and supper. Discussed avoidance of eating within 2 hours of lying down to sleep and benefit of blocks to elevate head of bed. Also, will benefit from avoiding carbonated drinks/sodas or food that has tomatoes, spicy or greasy food. -If symptoms are controlled in follow-up appointment, we will consider switching nighttime dose of omeprazole to famotidine -Keep working on weight loss, work on implementing low carbohydrate diet  All questions were answered.      Sergio Blazing, MD Gastroenterology and Hepatology [facility]

## 2022-11-08 NOTE — Patient Instructions (Addendum)
Increase omeprazole to 40 mg twice a day.  Explained presumed etiology of reflux symptoms. Instruction provided in the use of antireflux medication - patient should take medication 30-45 minutes before eating breakfast and supper. Discussed avoidance of eating within 2 hours of lying down to sleep and benefit of blocks to elevate head of bed. Also, will benefit from avoiding carbonated drinks/sodas or food that has tomatoes, spicy or greasy food. If symptoms are controlled in follow-up appointment, we will consider switching nighttime dose of omeprazole to famotidine Keep working on weight loss, work on implementing low carbohydrate diet TIF pamphlet provided

## 2022-12-06 ENCOUNTER — Other Ambulatory Visit: Payer: Self-pay

## 2022-12-06 DIAGNOSIS — K219 Gastro-esophageal reflux disease without esophagitis: Secondary | ICD-10-CM

## 2022-12-06 MED ORDER — OMEPRAZOLE 40 MG PO CPDR
40.0000 mg | DELAYED_RELEASE_CAPSULE | Freq: Two times a day (BID) | ORAL | 1 refills | Status: DC
Start: 2022-12-06 — End: 2023-05-10

## 2022-12-28 ENCOUNTER — Ambulatory Visit
Admission: EM | Admit: 2022-12-28 | Discharge: 2022-12-28 | Disposition: A | Discharge status: Home / Self Care | Payer: BC Managed Care – PPO | Attending: Family Medicine | Admitting: Family Medicine

## 2022-12-28 ENCOUNTER — Ambulatory Visit: Payer: BC Managed Care – PPO

## 2022-12-28 DIAGNOSIS — R058 Other specified cough: Secondary | ICD-10-CM | POA: Diagnosis not present

## 2022-12-28 DIAGNOSIS — J208 Acute bronchitis due to other specified organisms: Secondary | ICD-10-CM

## 2022-12-28 MED ORDER — IPRATROPIUM-ALBUTEROL 0.5-2.5 (3) MG/3ML IN SOLN
3.0000 mL | Freq: Once | RESPIRATORY_TRACT | Status: AC
  Administered 2022-12-28: 3 mL via RESPIRATORY_TRACT

## 2022-12-28 MED ORDER — PREDNISONE 20 MG PO TABS: 40.0000 mg | ORAL_TABLET | Freq: Every day | ORAL | 0 refills | Status: DC

## 2022-12-28 MED ORDER — PROMETHAZINE-DM 6.25-15 MG/5ML PO SYRP: 5.0000 mL | ORAL_SOLUTION | Freq: Four times a day (QID) | ORAL | 0 refills | Status: DC | PRN

## 2022-12-28 MED ORDER — ALBUTEROL SULFATE HFA 108 (90 BASE) MCG/ACT IN AERS: 2.0000 | INHALATION_SPRAY | RESPIRATORY_TRACT | 0 refills | Status: AC | PRN

## 2022-12-28 NOTE — ED Provider Notes (Signed)
[facility]    CSN: [id number] Arrival date & time: 12/28/22  2841      History   Chief Complaint No chief complaint on file.   HPI Sergio Gonzalez is a 47 y.o. male.   Patient presenting today with 5-day history of productive cough, raspy breathing, congestion.  Denies fever, chills, chest pain, shortness of breath, abdominal pain, nausea vomiting or diarrhea.  So far trying Mucinex with minimal relief.  No known history of chronic pulmonary disease however is a former cigarette smoker.  History of pneumonia earlier this year.    Past Medical History:  Diagnosis Date   Ascending aortic aneurysm (HCC)    Avascular necrosis of femur head, right (HCC)    GERD (gastroesophageal reflux disease)    H/O hiatal hernia    Hyperlipidemia    Smoker     Patient Active Problem List   Diagnosis Date Noted   Ascending aortic aneurysm (HCC)    GERD (gastroesophageal reflux disease) 09/20/2022   Personal history of COVID-19 10/12/2019   Tobacco abuse counseling 10/12/2019   Family history of heart disease 10/12/2019   HIP PAIN 04/18/2007    Past Surgical History:  Procedure Laterality Date   ANTERIOR CERVICAL DECOMP/DISCECTOMY FUSION  08/20/2011   Procedure: ANTERIOR CERVICAL DECOMPRESSION/DISCECTOMY FUSION 2 LEVELS;  Surgeon: [name], MD;  Location: [facility];  Service: Neurosurgery;  Laterality: Bilateral;  Cervical five-six, six-seven anterior cervical discectomy with fusion   BIOPSY  10/08/2022   Procedure: BIOPSY;  Surgeon: Dolores Frame, MD;  [facility];  Service: Gastroenterology;;   COLONOSCOPY WITH PROPOFOL N/A 10/08/2022   Procedure: COLONOSCOPY WITH PROPOFOL;  Surgeon: Dolores Frame, MD;  [facility];  Service: Gastroenterology;  Laterality: N/A;  7:30 am, asa 1   ESOPHAGOGASTRODUODENOSCOPY (EGD) WITH PROPOFOL N/A 10/08/2022   Procedure: ESOPHAGOGASTRODUODENOSCOPY (EGD) WITH PROPOFOL;  Surgeon: Dolores Frame, MD;  [facility];  Service: Gastroenterology;  Laterality: N/A;   EYE SURGERY     HEMOSTASIS CLIP PLACEMENT  10/08/2022   Procedure: HEMOSTASIS CLIP PLACEMENT;  Surgeon: Dolores Frame, MD;  [facility];  Service: Gastroenterology;;   lasic     POLYPECTOMY  10/08/2022   Procedure: POLYPECTOMY INTESTINAL;  Surgeon: Dolores Frame, MD;  [facility];  Service: Gastroenterology;;  hot snare and cold snare   SPINE SURGERY     SUBMUCOSAL LIFTING INJECTION  10/08/2022   Procedure: SUBMUCOSAL LIFTING INJECTION;  Surgeon: Dolores Frame, MD;  [facility];  Service: Gastroenterology;;   TONSILLECTOMY         Home Medications    Prior to Admission medications   Medication Sig Start Date End Date Taking? Authorizing Provider  albuterol (VENTOLIN HFA) 108 (90 Base) MCG/ACT inhaler Inhale 2 puffs into the lungs every 4 (four) hours as needed. 12/28/22  Yes Particia Nearing, PA-C  predniSONE (DELTASONE) 20 MG tablet Take 2 tablets (40 mg total) by mouth daily with breakfast. 12/28/22  Yes Particia Nearing, PA-C  promethazine-dextromethorphan (PROMETHAZINE-DM) 6.25-15 MG/5ML syrup Take 5 mLs by mouth 4 (four) times daily as needed. 12/28/22  Yes Particia Nearing, PA-C  allopurinol (ZYLOPRIM) 300 MG tablet TAKE 1 TABLET(300 MG) BY MOUTH DAILY 10/07/22   [name], MD  colchicine 0.6 MG tablet TAKE 2 TABLETS BY MOUTH AT FIRST SIGN OF GOUT, THEN 1 TABLET IN 1 HOUR IF PAIN CONTINUES 09/17/22   [name],  MD  omeprazole (PRILOSEC) 40 MG capsule Take 1 capsule (40 mg total) by mouth in the morning and at bedtime. 12/06/22   [name], MD  pravastatin (PRAVACHOL) 20 MG tablet TAKE 1 TABLET(20 MG) BY MOUTH DAILY 01/04/22   [name], MD  sildenafil (VIAGRA) 100 MG tablet TAKE 1/2 TO 1 TABLET(50 TO 100 MG) BY MOUTH DAILY AS NEEDED FOR ERECTILE DYSFUNCTION 10/06/22   [name], MD    Family History Family History  Problem Relation Age of Onset   Cancer Other        family history    Diabetes Other        family history    Cancer Mother    Cancer Maternal Grandfather    Cancer Paternal Grandfather     Social History Social History   Tobacco Use   Smoking status: Former    Types: Cigarettes    Start date: 07/2022   Smokeless tobacco: Current    Types: Chew   Tobacco comments:    1 pack of cigarettes every other week   Vaping Use   Vaping status: Never Used  Substance Use Topics   Alcohol use: Yes    Comment: twice weekly, usually on weekends   Drug use: No     Allergies   Patient has no known allergies.   Review of Systems Review of Systems Per HPI  Physical Exam Triage Vital Signs ED Triage Vitals  Encounter Vitals Group     BP 12/28/22 1001 (!) 145/96     Systolic BP Percentile --      Diastolic BP Percentile --      Pulse Rate 12/28/22 1001 70     Resp 12/28/22 1001 18     Temp 12/28/22 1001 98.2 F (36.8 C)     Temp Source 12/28/22 1001 Oral     SpO2 12/28/22 1001 98 %     Weight --      Height --      Head Circumference --      Peak Flow --      Pain Score 12/28/22 1002 0     Pain Loc --      Pain Education --      Exclude from Growth Chart --    No data found.  Updated Vital Signs BP (!) 145/96 (BP Location: Right Arm)   Pulse 70   Temp 98.2 F (36.8 C) (Oral)   Resp 18   SpO2 98%   Visual Acuity Right Eye Distance:   Left Eye Distance:   Bilateral Distance:    Right Eye Near:   Left Eye Near:    Bilateral Near:     Physical Exam Vitals and nursing note reviewed.  Constitutional:      Appearance: He is well-developed.  HENT:     Head: Atraumatic.     Right Ear: External ear normal.     Left Ear: External ear normal.     Nose: Rhinorrhea present.     Mouth/Throat:     Pharynx: Posterior oropharyngeal erythema present. No oropharyngeal exudate.  Eyes:     Conjunctiva/sclera: Conjunctivae  normal.     Pupils: Pupils are equal, round, and reactive to light.  Cardiovascular:     Rate and Rhythm: Normal rate and regular rhythm.  Pulmonary:     Effort: Pulmonary effort is normal. No respiratory distress.     Breath sounds: Wheezing present. No rales.  Musculoskeletal:  General: Normal range of motion.     Cervical back: Normal range of motion and neck supple.  Lymphadenopathy:     Cervical: No cervical adenopathy.  Skin:    General: Skin is warm and dry.  Neurological:     Mental Status: He is alert and oriented to person, place, and time.  Psychiatric:        Behavior: Behavior normal.      UC Treatments / Results  Labs (all labs ordered are listed, but only abnormal results are displayed) Labs Reviewed - No data to display  EKG   Radiology DG Chest 2 View  Result Date: 12/28/2022 CLINICAL DATA:  Productive cough for 5 days. EXAM: CHEST - 2 VIEW COMPARISON:  July 31, 2022. FINDINGS: The heart size and mediastinal contours are within normal limits. Both lungs are clear. The visualized skeletal structures are unremarkable. IMPRESSION: No active cardiopulmonary disease. Electronically Signed   By: Lupita Raider M.D.   On: 12/28/2022 10:26    Procedures Procedures (including critical care time)  Medications Ordered in UC Medications  ipratropium-albuterol (DUONEB) 0.5-2.5 (3) MG/3ML nebulizer solution 3 mL (3 mLs Nebulization Given 12/28/22 1018)    Initial Impression / Assessment and Plan / UC Course  I have reviewed the triage vital signs and the nursing notes.  Pertinent labs & imaging results that were available during my care of the patient were reviewed by me and considered in my medical decision making (see chart for details).     Minimally hypertensive in triage, otherwise vital signs within normal limits.  He is overall well-appearing and in no acute distress.  Mild improvement in wheezes with DuoNeb treatment in clinic, chest x-ray negative  for acute cardiopulmonary abnormality.  Suspect viral bronchitis.  Treat with prednisone, albuterol, Phenergan DM, supportive over-the-counter medications and home care.  Return for worsening symptoms.  Final Clinical Impressions(s) / UC Diagnoses   Final diagnoses:  Viral bronchitis     Discharge Instructions      Your x-ray today did not show any pneumonia.  We will treat for viral bronchitis with a course of steroid, cough syrup and albuterol inhaler.  You may continue taking Mucinex, and use things like Flonase, humidifiers, vapor rubs additionally    ED Prescriptions     Medication Sig Dispense Auth. Provider   predniSONE (DELTASONE) 20 MG tablet Take 2 tablets (40 mg total) by mouth daily with breakfast. 10 tablet Particia Nearing, PA-C   albuterol (VENTOLIN HFA) 108 (90 Base) MCG/ACT inhaler Inhale 2 puffs into the lungs every 4 (four) hours as needed. 18 g Roosvelt Maser [employer], [place]   promethazine-dextromethorphan (PROMETHAZINE-DM) 6.25-15 MG/5ML syrup Take 5 mLs by mouth 4 (four) times daily as needed. 100 mL Particia Nearing, [place]      PDMP not reviewed this encounter.   Particia Nearing, [place] 12/28/22 1059

## 2022-12-28 NOTE — Discharge Instructions (Signed)
Your x-ray today did not show any pneumonia.  We will treat for viral bronchitis with a course of steroid, cough syrup and albuterol inhaler.  You may continue taking Mucinex, and use things like Flonase, humidifiers, vapor rubs additionally

## 2022-12-28 NOTE — ED Triage Notes (Signed)
Pt reports cough, started Friday comes in spells, unable to cough up phlegm. Feels like a deep chest cough, deep breaths feel raspy.

## 2022-12-30 ENCOUNTER — Other Ambulatory Visit: Payer: Self-pay | Admitting: Family Medicine

## 2023-01-03 NOTE — Telephone Encounter (Signed)
Requested Prescriptions  Pending Prescriptions Disp Refills   pravastatin (PRAVACHOL) 20 MG tablet [Pharmacy Med Name: PRAVASTATIN 20MG  TABLETS] 90 tablet 0    Sig: TAKE 1 TABLET(20 MG) BY MOUTH DAILY     Cardiovascular:  Antilipid - Statins Failed - 12/30/2022  6:22 AM      Failed - Valid encounter within last 12 months    Recent Outpatient Visits           1 year ago Lumbar herniated disc   [facility] [name], MD   2 years ago Spasm of muscle of lower back   [facility] Pickard, Priscille Heidelberg, MD   2 years ago Need for immunization against influenza   [facility] Tanya Nones, Priscille Heidelberg, MD   3 years ago Dyspnea on exertion   [facility] [name], MD   4 years ago Fatty liver disease, nonalcoholic   [facility] Pickard, Priscille Heidelberg, MD              Failed - Lipid Panel in normal range within the last 12 months    Cholesterol  Date Value Ref Range Status  03/08/2022 230 (H) <200 mg/dL Final   LDL Cholesterol (Calc)  Date Value Ref Range Status  03/08/2022 135 (H) mg/dL (calc) Final    Comment:    Reference range: <100 . Desirable range <100 mg/dL for primary prevention;   <70 mg/dL for patients with CHD or diabetic patients  with > or = 2 CHD risk factors. Marland Kitchen LDL-C is now calculated using the Martin-Hopkins  calculation, which is a validated novel method providing  better accuracy than the Friedewald equation in the  estimation of LDL-C.  Horald Pollen et al. Lenox Ahr. 2956;213(08): 2061-2068  (http://education.[facility].com/faq/FAQ164)    HDL  Date Value Ref Range Status  03/08/2022 55 > OR = 40 mg/dL Final   Triglycerides  Date Value Ref Range Status  03/08/2022 258 (H) <150 mg/dL Final    Comment:    . If a non-fasting specimen was collected, consider repeat triglyceride testing on a fasting specimen if clinically indicated.  Perry Mount et al. J. of Clin. Lipidol.  2015;9:129-169. Marland Kitchen          Passed - Patient is not pregnant       allopurinol (ZYLOPRIM) 300 MG tablet [Pharmacy Med Name: ALLOPURINOL 300MG  TABLETS] 90 tablet 0    Sig: TAKE 1 TABLET(300 MG) BY MOUTH DAILY     Endocrinology:  Gout Agents - allopurinol Failed - 12/30/2022  6:22 AM      Failed - Uric Acid in normal range and within 360 days    Uric Acid, Serum  Date Value Ref Range Status  09/21/2018 9.9 (H) 4.0 - 8.0 mg/dL Final    Comment:    Therapeutic target for gout patients: <6.0 mg/dL .          Failed - Valid encounter within last 12 months    Recent Outpatient Visits           1 year ago Lumbar herniated disc   [facility] [name], MD   2 years ago Spasm of muscle of lower back   [facility] Pickard, Priscille Heidelberg, MD   2 years ago Need for immunization against influenza   [facility] Tanya Nones Priscille Heidelberg, MD   3 years ago Dyspnea on exertion   [facility] [place],  Priscille Heidelberg, MD   4 years ago Fatty liver disease, nonalcoholic   [facility] Pickard, Priscille Heidelberg, MD              Passed - Cr in normal range and within 360 days    Creat  Date Value Ref Range Status  03/08/2022 1.04 0.60 - 1.29 mg/dL Final   Creatinine, Ser  Date Value Ref Range Status  07/31/2022 0.97 0.61 - 1.24 mg/dL Final         Passed - CBC within normal limits and completed in the last 12 months    WBC  Date Value Ref Range Status  07/31/2022 12.5 (H) 4.0 - 10.5 K/uL Final   RBC  Date Value Ref Range Status  07/31/2022 4.96 4.22 - 5.81 MIL/uL Final   Hemoglobin  Date Value Ref Range Status  07/31/2022 15.4 13.0 - 17.0 g/dL Final   HCT  Date Value Ref Range Status  07/31/2022 45.5 39.0 - 52.0 % Final   MCHC  Date Value Ref Range Status  07/31/2022 33.8 30.0 - 36.0 g/dL Final   [facility]  Date Value Ref Range Status  07/31/2022 31.0 26.0 - 34.0 pg Final   MCV  Date Value Ref Range  Status  07/31/2022 91.7 80.0 - 100.0 fL Final   No results found for: "PLTCOUNTKUC", "LABPLAT", "POCPLA" RDW  Date Value Ref Range Status  07/31/2022 12.9 11.5 - 15.5 % Final

## 2023-03-01 DIAGNOSIS — M79675 Pain in left toe(s): Secondary | ICD-10-CM | POA: Diagnosis not present

## 2023-03-01 DIAGNOSIS — M79672 Pain in left foot: Secondary | ICD-10-CM | POA: Diagnosis not present

## 2023-03-01 DIAGNOSIS — L03032 Cellulitis of left toe: Secondary | ICD-10-CM | POA: Diagnosis not present

## 2023-03-01 DIAGNOSIS — L600 Ingrowing nail: Secondary | ICD-10-CM | POA: Diagnosis not present

## 2023-03-15 DIAGNOSIS — L03032 Cellulitis of left toe: Secondary | ICD-10-CM | POA: Diagnosis not present

## 2023-03-15 DIAGNOSIS — M79675 Pain in left toe(s): Secondary | ICD-10-CM | POA: Diagnosis not present

## 2023-03-15 DIAGNOSIS — M79672 Pain in left foot: Secondary | ICD-10-CM | POA: Diagnosis not present

## 2023-03-15 DIAGNOSIS — L600 Ingrowing nail: Secondary | ICD-10-CM | POA: Diagnosis not present

## 2023-03-24 ENCOUNTER — Encounter: Payer: Self-pay | Admitting: Family Medicine

## 2023-03-29 DIAGNOSIS — L03032 Cellulitis of left toe: Secondary | ICD-10-CM | POA: Diagnosis not present

## 2023-03-29 DIAGNOSIS — L600 Ingrowing nail: Secondary | ICD-10-CM | POA: Diagnosis not present

## 2023-03-29 DIAGNOSIS — M79672 Pain in left foot: Secondary | ICD-10-CM | POA: Diagnosis not present

## 2023-03-29 DIAGNOSIS — M79675 Pain in left toe(s): Secondary | ICD-10-CM | POA: Diagnosis not present

## 2023-04-01 ENCOUNTER — Other Ambulatory Visit: Payer: Self-pay | Admitting: Family Medicine
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
## 2023-05-10 ENCOUNTER — Ambulatory Visit (INDEPENDENT_AMBULATORY_CARE_PROVIDER_SITE_OTHER): Payer: BC Managed Care – PPO | Admitting: Gastroenterology

## 2023-05-10 ENCOUNTER — Encounter (INDEPENDENT_AMBULATORY_CARE_PROVIDER_SITE_OTHER): Payer: Self-pay | Admitting: Gastroenterology

## 2023-05-10 VITALS — BP 121/86 | HR 84 | Temp 97.8°F | Ht 76.0 in | Wt 304.7 lb

## 2023-05-10 DIAGNOSIS — K219 Gastro-esophageal reflux disease without esophagitis: Secondary | ICD-10-CM | POA: Diagnosis not present

## 2023-05-10 MED ORDER — OMEPRAZOLE 40 MG PO CPDR: 40.0000 mg | DELAYED_RELEASE_CAPSULE | Freq: Two times a day (BID) | ORAL | 1 refills | Status: DC

## 2023-05-10 MED ORDER — FAMOTIDINE 40 MG PO TABS
40.0000 mg | ORAL_TABLET | Freq: Every day | ORAL | 1 refills | Status: DC
Start: 2023-05-10 — End: 2023-08-08

## 2023-05-10 NOTE — Progress Notes (Signed)
 Referring Provider: [name], MD Primary Care Physician:  [name], MD Primary GI Physician: Dr. Levon Hedger   Chief Complaint  Patient presents with   Follow-up    Patient here today for a follow up on Gerd. Patient says this is better,but he is still having issues with the reflux waking him at night. He is currently on omeprazole 40 mg bid. He says this is the only otc ppi he has been prescribed. He says he is taking a "handful" of Tums to help treat the breakthrough symptoms.    HPI:   Sergio Gonzalez is a 48 y.o. male with past medical history of GERD and HLD   Patient presenting today for:  Follow up of GERD  Patient last seen October 2024 or heartburn was first after his EGD felt better after he started presenting on regular basis.  Frequent regurgitation at night.  Some abdominal cramping at times.  Patient recommended to continue omeprazole 40 mg daily, maintain good reflux precautions, keep working on weight loss.  Present: States GERD is doing better but still having more regurgitation, especially at night. Usually occurs about 2-3 times per week, worse at night. Taking a few tums when this happens. He tries to avoid eating later but not always possible with his job. He is trying to take first PPI dose around 7am, taking second dose sometimes later after dinner depending what is going on with his family. He states he does better if he takes this second dose before dinner.   He is trying to eat smaller portions and is going to the gym but notes he does not eat healthy food. Feeling discouraged that his weight has actually gone up. He avoid caffeine but does drink alcohol on the weekends some.   No red flag symptoms. Patient denies melena, hematochezia, nausea, vomiting, diarrhea, constipation, dysphagia, odyonophagia, early satiety or weight loss.    Last EGD:10/08/2022 Salmon colored mucosa and squamous islands at 39 cm which were biopsied, normal stomach and  duodenum. Biopsies were within normal limits.   Last Colonoscopy: 10/08/2022 - Three 2 to 10 mm polyps in the ascending colon and in the cecum, removed with a cold snare. Resected and retrieved. - One 12 to 15 mm polyp in the proximal ascending colon, removed with a hot snare. Resected and retrieved. Clip was placed. Clip manufacturer: [employer]. - One 15 mm polyp in the transverse colon, removed with a hot snare. Resected and retrieved via EMR. Clips were placed. Clip manufacturer: [employer]. - Two 3 to 5 mm polyps in the transverse colon, removed with a cold snare. Resected and retrieved. - Non- bleeding internal hemorrhoids.   The pathology showed the samples from the esophagus. Five of the polyps were tubular adenomas    Recommended repeat colonoscopy in 3 years Filed Weights   05/10/23 0943  Weight: (!) 304 lb 11.2 oz (138.2 kg)     Past Medical History:  Diagnosis Date   Ascending aortic aneurysm (HCC)    Avascular necrosis of femur head, right (HCC)    GERD (gastroesophageal reflux disease)    H/O hiatal hernia    Hyperlipidemia    Smoker     Past Surgical History:  Procedure Laterality Date   ANTERIOR CERVICAL DECOMP/DISCECTOMY FUSION  08/20/2011   Procedure: ANTERIOR CERVICAL DECOMPRESSION/DISCECTOMY FUSION 2 LEVELS;  Surgeon: [name], MD;  [facility];  Service: Neurosurgery;  Laterality: Bilateral;  Cervical five-six, six-seven anterior cervical discectomy with fusion  BIOPSY  10/08/2022   Procedure: BIOPSY;  Surgeon: Dolores Frame, MD;  [facility]: [facility];  Service: Gastroenterology;;   COLONOSCOPY WITH PROPOFOL N/A 10/08/2022   Procedure: COLONOSCOPY WITH PROPOFOL;  Surgeon: Dolores Frame, MD;  [facility];  Service: Gastroenterology;  Laterality: N/A;  7:30 am, asa 1   ESOPHAGOGASTRODUODENOSCOPY (EGD) WITH PROPOFOL N/A 10/08/2022   Procedure: ESOPHAGOGASTRODUODENOSCOPY (EGD) WITH PROPOFOL;  Surgeon:  Dolores Frame, MD;  [facility];  Service: Gastroenterology;  Laterality: N/A;   EYE SURGERY     HEMOSTASIS CLIP PLACEMENT  10/08/2022   Procedure: HEMOSTASIS CLIP PLACEMENT;  Surgeon: Dolores Frame, MD;  [facility];  Service: Gastroenterology;;   lasic     POLYPECTOMY  10/08/2022   Procedure: POLYPECTOMY INTESTINAL;  Surgeon: Dolores Frame, MD;  [facility];  Service: Gastroenterology;;  hot snare and cold snare   SPINE SURGERY     SUBMUCOSAL LIFTING INJECTION  10/08/2022   Procedure: SUBMUCOSAL LIFTING INJECTION;  Surgeon: Dolores Frame, MD;  [facility];  Service: Gastroenterology;;   TONSILLECTOMY      Current Outpatient Medications  Medication Sig Dispense Refill   albuterol (VENTOLIN HFA) 108 (90 Base) MCG/ACT inhaler Inhale 2 puffs into the lungs every 4 (four) hours as needed. 18 g 0   allopurinol (ZYLOPRIM) 300 MG tablet TAKE 1 TABLET(300 MG) BY MOUTH DAILY 90 tablet 0   colchicine 0.6 MG tablet TAKE 2 TABLETS BY MOUTH AT FIRST SIGN OF GOUT, THEN 1 TABLET IN 1 HOUR IF PAIN CONTINUES 90 tablet 0   omeprazole (PRILOSEC) 40 MG capsule Take 1 capsule (40 mg total) by mouth in the morning and at bedtime. 180 capsule 1   pravastatin (PRAVACHOL) 20 MG tablet TAKE 1 TABLET(20 MG) BY MOUTH DAILY 90 tablet 0   predniSONE (DELTASONE) 20 MG tablet Take 2 tablets (40 mg total) by mouth daily with breakfast. 10 tablet 0   promethazine-dextromethorphan (PROMETHAZINE-DM) 6.25-15 MG/5ML syrup Take 5 mLs by mouth 4 (four) times daily as needed. 100 mL 0   sildenafil (VIAGRA) 100 MG tablet TAKE 1/2 TO 1 TABLET(50 TO 100 MG) BY MOUTH DAILY AS NEEDED FOR ERECTILE DYSFUNCTION 15 tablet 1   No current facility-administered medications for this visit.    Allergies as of 05/10/2023   (No Known Allergies)    Social History   Socioeconomic History   Marital status: Married    Spouse name: Not on file   Number  of children: Not on file   Years of education: 70yr [employer]   Highest education level: Not on file  Occupational History   Occupation: farm work   Tobacco Use   Smoking status: Former    Types: Cigarettes    Start date: 07/2022   Smokeless tobacco: Current    Types: Chew   Tobacco comments:    1 pack of cigarettes every other week   Vaping Use   Vaping status: Never Used  Substance and Sexual Activity   Alcohol use: Yes    Comment: twice weekly, usually on weekends   Drug use: No   Sexual activity: Yes  Other Topics Concern   Not on file  Social History Narrative   Not on file   Social Drivers of Health   Financial Resource Strain: Not on file  Food Insecurity: No Food Insecurity (08/04/2022)   Hunger Vital Sign    Worried About Running Out of Food in the Last Year:  Never true    Ran Out of Food in the Last Year: Never true  Transportation Needs: No Transportation Needs (08/04/2022)   PRAPARE - Administrator, Civil Service (Medical): No    Lack of Transportation (Non-Medical): No  Physical Activity: Not on file  Stress: Not on file  Social Connections: Not on file    Review of systems General: negative for malaise, night sweats, fever, chills, weight loss Neck: Negative for lumps, goiter, pain and significant neck swelling Resp: Negative for cough, wheezing, dyspnea at rest CV: Negative for chest pain, leg swelling, palpitations, orthopnea GI: denies melena, hematochezia, nausea, vomiting, diarrhea, constipation, dysphagia, odyonophagia, early satiety or unintentional weight loss. +reflux symptoms  The remainder of the review of systems is noncontributory.  Physical Exam: BP (!) 140/85 (BP Location: Left Arm, Patient Position: Sitting, Cuff Size: Large)   Pulse 90   Temp 97.8 F (36.6 C) (Temporal)   Ht 6\' 4"  (1.93 m)   Wt (!) 304 lb 11.2 oz (138.2 kg)   BMI 37.09 kg/m  General:   Alert and oriented. No distress noted. Pleasant and cooperative.  Head:   Normocephalic and atraumatic. Eyes:  Conjuctiva clear without scleral icterus. Mouth:  Oral mucosa pink and moist. Good dentition. No lesions. Heart: Normal rate and rhythm, s1 and s2 heart sounds present.  Lungs: Clear lung sounds in all lobes. Respirations equal and unlabored. Abdomen:  +BS, soft, non-tender and non-distended. No rebound or guarding. No HSM or masses noted. Neurologic:  Alert and  oriented x4 Psych:  Alert and cooperative. Normal mood and affect.  Invalid input(s): "6 MONTHS"   ASSESSMENT: Sergio Gonzalez is a 48 y.o. male presenting today for follow up of GERD  GERD doing better on omeprazole 40mg  BID though still having regurgitation some at night, occurring about 2-3 times per week, taking tums PRN.  He does note that symptoms seem to do better when he was taking his evening PPI dose prior to dinner and avoids eating late which is not always possible with his job and family applications.  We discussed importance of timing of PPI, recommend continuing first dose around 7 AM, he can also set reminder his phone to help remember to take evening dose about an hour before dinner.  He should continue to try to avoid eating late and stay upright 2 to 3 hours after eating, as well as elevating head of bed and taking reflux precautions with his diet.  Continue with PPI twice daily and add famotidine 40 mg to take prior to bedtime.  Symptoms or not improved at next visit may consider changing PPI therapy altogether and consider voquezna. Unfortunately he is not a TIF candidate at this time given his BMI though he is working on his weight. Patient was encouraged to be mindful of alcohol intake, try to increase physical activity and protein focused diet to help with weight management.    PLAN:  Continue omeprazole 40mg  BID, try to take second dose prior to dinner and avoid eating late 2. Add famotidine 40mg  at bedtime  3. Good reflux precautions  4. Continue to work on [employer]  with plenty of physical activity and protein focused diet 5. Consider voquezna at follow up if symptoms not improving  6. Could consider TIF in future if he has weight loss/decreased BMI   All questions were answered, patient verbalized understanding and is in agreement with plan as outlined above.   Follow Up: 4 months   [address]  Katina Degree, MSN, APRN, AGNP-C Adult-Gerontology Nurse Practitioner [facility]  I have reviewed the note and agree with the APP's assessment as described in this progress note  [name], MD Gastroenterology and Hepatology [facility]

## 2023-05-10 NOTE — Patient Instructions (Signed)
-  Please continue omeprazole 40 mg twice a day -Continue taking first dose around 7 AM, aim for timing of second dose to be about an hour before dinner in the evenings, you can try putting a reminder in your phone to help remember to take this at the correct time. -I Will add famotidine 40 mg to be taken 30 minutes to an hour prior to bedtime -Be mindful of greasy, spicy, fried, citrus foods,  caffeine, carbonated drinks, chocolate and alcohol as these can increase reflux symptoms -Stay upright 2-3 hours after eating, prior to lying down and avoid eating late in the evenings -you can also try elevating your head at night to help avoid reflux symptoms -focus on high protein diet and plenty of physical activity to help with good weight management   Follow up 4 months  It was a pleasure to see you today. I want to create trusting relationships with patients and provide genuine, compassionate, and quality care. I truly value your feedback! please be on the lookout for a survey regarding your visit with me today. I appreciate your input about our visit and your time in completing this!    Avanelle Pixley L. Jeanmarie Hubert, MSN, APRN, AGNP-C Adult-Gerontology Nurse Practitioner [facility] at [facility]

## 2023-06-10 ENCOUNTER — Encounter (HOSPITAL_COMMUNITY): Payer: Self-pay

## 2023-06-17 ENCOUNTER — Ambulatory Visit: Admitting: Family Medicine

## 2023-06-17 VITALS — BP 122/76 | HR 76 | Temp 98.7°F | Ht 76.0 in | Wt 298.0 lb

## 2023-06-17 DIAGNOSIS — R5383 Other fatigue: Secondary | ICD-10-CM

## 2023-06-17 DIAGNOSIS — Z125 Encounter for screening for malignant neoplasm of prostate: Secondary | ICD-10-CM | POA: Diagnosis not present

## 2023-06-17 DIAGNOSIS — I7121 Aneurysm of the ascending aorta, without rupture: Secondary | ICD-10-CM | POA: Diagnosis not present

## 2023-06-17 DIAGNOSIS — E7800 Pure hypercholesterolemia, unspecified: Secondary | ICD-10-CM

## 2023-06-17 LAB — CBC WITH DIFFERENTIAL/PLATELET
Absolute Lymphocytes: 2196 {cells}/uL (ref 850–3900)
Absolute Monocytes: 439 {cells}/uL (ref 200–950)
Basophils Absolute: 50 {cells}/uL (ref 0–200)
Basophils Relative: 0.7 %
Eosinophils Absolute: 151 {cells}/uL (ref 15–500)
Eosinophils Relative: 2.1 %
HCT: 48.2 % (ref 38.5–50.0)
Hemoglobin: 16.2 g/dL (ref 13.2–17.1)
MCH: 31.6 pg (ref 27.0–33.0)
MCHC: 33.6 g/dL (ref 32.0–36.0)
MCV: 94 fL (ref 80.0–100.0)
MPV: 11.3 fL (ref 7.5–12.5)
Monocytes Relative: 6.1 %
Neutro Abs: 4363 {cells}/uL (ref 1500–7800)
Neutrophils Relative %: 60.6 %
Platelets: 161 10*3/uL (ref 140–400)
RBC: 5.13 10*6/uL (ref 4.20–5.80)
RDW: 12.6 % (ref 11.0–15.0)
Total Lymphocyte: 30.5 %
WBC: 7.2 10*3/uL (ref 3.8–10.8)

## 2023-06-17 LAB — COMPREHENSIVE METABOLIC PANEL WITH GFR
AG Ratio: 1.8 (calc) (ref 1.0–2.5)
ALT: 51 U/L — ABNORMAL HIGH (ref 9–46)
AST: 29 U/L (ref 10–40)
Albumin: 4.4 g/dL (ref 3.6–5.1)
Alkaline phosphatase (APISO): 91 U/L (ref 36–130)
BUN: 10 mg/dL (ref 7–25)
CO2: 25 mmol/L (ref 20–32)
Calcium: 9.3 mg/dL (ref 8.6–10.3)
Chloride: 105 mmol/L (ref 98–110)
Creat: 0.98 mg/dL (ref 0.60–1.29)
Globulin: 2.4 g/dL (ref 1.9–3.7)
Glucose, Bld: 99 mg/dL (ref 65–99)
Potassium: 4 mmol/L (ref 3.5–5.3)
Sodium: 140 mmol/L (ref 135–146)
Total Bilirubin: 0.8 mg/dL (ref 0.2–1.2)
Total Protein: 6.8 g/dL (ref 6.1–8.1)
eGFR: 96 mL/min/{1.73_m2} (ref >=60)

## 2023-06-17 LAB — LIPID PANEL
Cholesterol: 210 mg/dL — ABNORMAL HIGH (ref <200)
HDL: 47 mg/dL (ref >=40)
LDL Cholesterol (Calc): 129 mg/dL — ABNORMAL HIGH
Non-HDL Cholesterol (Calc): 163 mg/dL — ABNORMAL HIGH (ref <130)
Total CHOL/HDL Ratio: 4.5 (calc) (ref <5.0)
Triglycerides: 199 mg/dL — ABNORMAL HIGH (ref <150)

## 2023-06-17 NOTE — Progress Notes (Signed)
 Subjective:    Patient ID: Sergio Gonzalez, male    DOB: Dec 20, 1975, 48 y.o.   MRN: [id number]   Last year I ordered a coronary artery calcium  score.  Patient's calcium  score was 0.  However he was found to have a 41 mm dilatation of the ascending aorta.  He is due to repeat an ultrasound of this 1 year later.  He continues drink alcohol occasionally.  He has a strong Vanstory that he would reduce.  His mother has a history of cirrhosis from this Past Medical History:  Diagnosis Date   Ascending aortic aneurysm (HCC)    Avascular necrosis of femur head, right (HCC)    GERD (gastroesophageal reflux disease)    H/O hiatal hernia    Hyperlipidemia    Smoker    Past Surgical History:  Procedure Laterality Date   ANTERIOR CERVICAL DECOMP/DISCECTOMY FUSION  08/20/2011   Procedure: ANTERIOR CERVICAL DECOMPRESSION/DISCECTOMY FUSION 2 LEVELS;  Surgeon: Augustine Blocker, MD;  [facility]: [facility];  Service: Neurosurgery;  Laterality: Bilateral;  Cervical five-six, six-seven anterior cervical discectomy with fusion   BIOPSY  10/08/2022   Procedure: BIOPSY;  Surgeon: Urban Garden, MD;  [facility];  Service: Gastroenterology;;   COLONOSCOPY WITH PROPOFOL  N/A 10/08/2022   Procedure: COLONOSCOPY WITH PROPOFOL ;  Surgeon: Urban Garden, MD;  [facility];  Service: Gastroenterology;  Laterality: N/A;  7:30 am, asa 1   ESOPHAGOGASTRODUODENOSCOPY (EGD) WITH PROPOFOL  N/A 10/08/2022   Procedure: ESOPHAGOGASTRODUODENOSCOPY (EGD) WITH PROPOFOL ;  Surgeon: Urban Garden, MD;  [facility];  Service: Gastroenterology;  Laterality: N/A;   EYE SURGERY     HEMOSTASIS CLIP PLACEMENT  10/08/2022   Procedure: HEMOSTASIS CLIP PLACEMENT;  Surgeon: Urban Garden, MD;  [facility];  Service: Gastroenterology;;   lasic     POLYPECTOMY  10/08/2022   Procedure: POLYPECTOMY INTESTINAL;  Surgeon: Urban Garden, MD;  [facility];  Service: Gastroenterology;;  hot snare and cold snare   SPINE SURGERY     SUBMUCOSAL LIFTING INJECTION  10/08/2022   Procedure: SUBMUCOSAL LIFTING INJECTION;  Surgeon: Urban Garden, MD;  [facility];  Service: Gastroenterology;;   TONSILLECTOMY     Current Outpatient Medications on File Prior to Visit  Medication Sig Dispense Refill   albuterol  (VENTOLIN  HFA) 108 (90 Base) MCG/ACT inhaler Inhale 2 puffs into the lungs every 4 (four) hours as needed. 18 g 0   allopurinol  (ZYLOPRIM ) 300 MG tablet TAKE 1 TABLET(300 MG) BY MOUTH DAILY 90 tablet 0   colchicine  0.6 MG tablet TAKE 2 TABLETS BY MOUTH AT FIRST SIGN OF GOUT, THEN 1 TABLET IN 1 HOUR IF PAIN CONTINUES 90 tablet 0   famotidine  (PEPCID ) 40 MG tablet Take 1 tablet (40 mg total) by mouth at bedtime. 60 tablet 1   omeprazole  (PRILOSEC) 40 MG capsule Take 1 capsule (40 mg total) by mouth in the morning and at bedtime. 180 capsule 1   pravastatin  (PRAVACHOL ) 20 MG tablet TAKE 1 TABLET(20 MG) BY MOUTH DAILY 90 tablet 0   sildenafil  (VIAGRA ) 100 MG tablet TAKE 1/2 TO 1 TABLET(50 TO 100 MG) BY MOUTH DAILY AS NEEDED FOR ERECTILE DYSFUNCTION 15 tablet 1   No current facility-administered medications on file prior to visit.   No Known Allergies Social History   Socioeconomic History   Marital status: Married    Spouse name: Not on file   Number of children: Not on  file   Years of education: 69yr [employer]   Highest education level: Not on file  Occupational History   Occupation: farm work   Tobacco Use   Smoking status: Former    Types: Cigarettes    Start date: 07/2022   Smokeless tobacco: Current    Types: Chew   Tobacco comments:    1 pack of cigarettes every other week   Vaping Use   Vaping status: Never Used  Substance and Sexual Activity   Alcohol use: Yes    Comment: twice weekly, usually on weekends   Drug use: No   Sexual activity: Yes  Other Topics Concern   Not on file  Social History  Narrative   Not on file   Social Drivers of Health   Financial Resource Strain: Not on file  Food Insecurity: No Food Insecurity (08/04/2022)   Hunger Vital Sign    Worried About Running Out of Food in the Last Year: Never true    Ran Out of Food in the Last Year: Never true  Transportation Needs: No Transportation Needs (08/04/2022)   PRAPARE - Administrator, Civil Service (Medical): No    Lack of Transportation (Non-Medical): No  Physical Activity: Not on file  Stress: Not on file  Social Connections: Not on file  Intimate Partner Violence: Not on file    Family History  Problem Relation Age of Onset   Cancer Other        family history    Diabetes Other        family history    Cancer Mother    Cancer Maternal Grandfather    Cancer Paternal Grandfather      Review of Systems  All other systems reviewed and are negative.      Objective:   Physical Exam Vitals reviewed.  Constitutional:      General: He is not in acute distress.    Appearance: Normal appearance. He is well-developed. He is obese. He is not ill-appearing, toxic-appearing or diaphoretic.  HENT:     Head: Normocephalic and atraumatic.     Right Ear: External ear normal.     Left Ear: External ear normal.     Nose: Nose normal.     Mouth/Throat:     Mouth: Mucous membranes are moist.     Pharynx: Oropharynx is clear. No oropharyngeal exudate or posterior oropharyngeal erythema.  Eyes:     Extraocular Movements: Extraocular movements intact.     Conjunctiva/sclera: Conjunctivae normal.     Pupils: Pupils are equal, round, and reactive to light.  Cardiovascular:     Rate and Rhythm: Normal rate and regular rhythm.     Heart sounds: Normal heart sounds. No murmur heard.    No friction rub. No gallop.  Pulmonary:     Effort: Pulmonary effort is normal. No respiratory distress.     Breath sounds: Normal breath sounds. No stridor. No wheezing, rhonchi or rales.  Chest:     Chest wall: No  tenderness.  Abdominal:     General: Bowel sounds are normal. There is no distension.     Palpations: Abdomen is soft. There is no mass.     Tenderness: There is no abdominal tenderness. There is no guarding or rebound.     Hernia: No hernia is present.  Musculoskeletal:     Right lower leg: No edema.     Left lower leg: No edema.  Skin:    General: Skin is warm.  Coloration: Skin is not jaundiced or pale.     Findings: No bruising, erythema, lesion or rash.  Neurological:     General: No focal deficit present.     Mental Status: He is alert and oriented to person, place, and time. Mental status is at baseline.     Cranial Nerves: No cranial nerve deficit.     Sensory: No sensory deficit.     Motor: No weakness.     Coordination: Coordination normal.     Gait: Gait normal.  Psychiatric:        Mood and Affect: Mood normal.        Behavior: Behavior normal.        Thought Content: Thought content normal.        Judgment: Judgment normal.           Assessment & Plan:  Aneurysm of ascending aorta without rupture (HCC) - Plan: ECHOCARDIOGRAM COMPLETE  Pure hypercholesterolemia - Plan: CBC with Differential/Platelet, Comprehensive metabolic panel with GFR, Lipid panel Schedule an echocardiogram to monitor the aneurysm of the ascending aorta.  Recommended we repeat this test annually.  Check CBC CMP and lipid panel.  If the patient's liver function test are elevated, I have recommended abstinence from alcohol and I will consider Zepbound to facilitate weight loss as a means to help manage fatty liver disease

## 2023-06-18 LAB — TESTOSTERONE: Testosterone: 662 ng/dL (ref 250–827)

## 2023-06-20 ENCOUNTER — Ambulatory Visit: Payer: Self-pay | Admitting: Family Medicine

## 2023-06-29 ENCOUNTER — Other Ambulatory Visit: Payer: Self-pay | Admitting: Family Medicine

## 2023-08-08 ENCOUNTER — Telehealth (INDEPENDENT_AMBULATORY_CARE_PROVIDER_SITE_OTHER): Payer: Self-pay | Admitting: Gastroenterology

## 2023-08-08 ENCOUNTER — Other Ambulatory Visit (INDEPENDENT_AMBULATORY_CARE_PROVIDER_SITE_OTHER): Payer: Self-pay

## 2023-08-08 DIAGNOSIS — K219 Gastro-esophageal reflux disease without esophagitis: Secondary | ICD-10-CM

## 2023-08-08 MED ORDER — FAMOTIDINE 40 MG PO TABS: 40.0000 mg | ORAL_TABLET | Freq: Every day | ORAL | 1 refills | Status: DC

## 2023-08-08 NOTE — Telephone Encounter (Signed)
 I spoke with the patient he was in need of famotidine  to be sent in to last until he has his appointment next month. I have submitted this to the patient requested pharmacy.

## 2023-08-08 NOTE — Telephone Encounter (Signed)
 Patient has OV to follow up with [facility] on 09/12/2023. He said he was going to need a refill on his prescription and would be out this week, there are no openings to put him in for this week. Can we send in a prescription to his pharmacy prior to his OV? [phone]

## 2023-08-08 NOTE — Telephone Encounter (Signed)
 Thanks, will sign this prescription

## 2023-08-09 NOTE — Telephone Encounter (Signed)
 Noted, Thanks

## 2023-09-12 ENCOUNTER — Ambulatory Visit (INDEPENDENT_AMBULATORY_CARE_PROVIDER_SITE_OTHER): Admitting: Gastroenterology

## 2023-09-12 ENCOUNTER — Encounter (INDEPENDENT_AMBULATORY_CARE_PROVIDER_SITE_OTHER): Payer: Self-pay | Admitting: Gastroenterology

## 2023-09-12 DIAGNOSIS — K219 Gastro-esophageal reflux disease without esophagitis: Secondary | ICD-10-CM | POA: Diagnosis not present

## 2023-09-12 MED ORDER — FAMOTIDINE 40 MG PO TABS: 40.0000 mg | ORAL_TABLET | Freq: Every day | ORAL | 3 refills | Status: AC

## 2023-09-12 NOTE — Progress Notes (Signed)
 Referring Provider: Duanne Butler DASEN, MD Primary Care Physician:  Duanne Butler DASEN, MD Primary GI Physician: Dr. Eartha   Chief Complaint  Patient presents with   Follow-up    Patient here today for a follow up on Gerd. Patient says this is much better. Has had a couple of episodes of reflux when he has eaten tomato based foods. He is taking omeprazole  40 mg bid, and famotidine  40 mg at bedtime.   HPI:   Sergio Gonzalez is a 48 y.o. male with past medical history of GERD, HLD  Patient presenting today for:  Follow up of GERD  Last seen April 2025, at that time, GERD doing better but still with some regurgitation at night. Using tums for breakthrough. Trying to eat smaller portions, going to the gym  Recommended to continue Omeprazole  40mg  BID, add famotidine  40mg  at bedtime, good reflux precautions, consider viquenza/TIF  Present:  Feels reflux is better controlled than it has been in the past doing omeprazole  40mg  BID and famotidine  40mg  at bedtime. Has had a few episodes of breakthrough if he ate late, but otherwise does well if he eats at a good time. No constipation, diarrhea, rectal bleeding or melena. Weight is stable, no appetite changes.   Last EGD:10/08/2022 Salmon colored mucosa and squamous islands at 39 cm which were biopsied, normal stomach and duodenum. Biopsies were within normal limits.   Last Colonoscopy: 10/08/2022 - Three 2 to 10 mm polyps in the ascending colon and in the cecum, removed with a cold snare. Resected and retrieved. - One 12 to 15 mm polyp in the proximal ascending colon, removed with a hot snare. Resected and retrieved. Clip was placed. Clip manufacturer: [employer]. - One 15 mm polyp in the transverse colon, removed with a hot snare. Resected and retrieved via EMR. Clips were placed. Clip manufacturer: [employer]. - Two 3 to 5 mm polyps in the transverse colon, removed with a cold snare. Resected and retrieved. - Non- bleeding internal  hemorrhoids.   The pathology showed negative samples from the esophagus. Five of the polyps were tubular adenomas    Recommended repeat colonoscopy in 3 years Filed Weights   09/12/23 1051  Weight: (!) 300 lb 3.2 oz (136.2 kg)     Past Medical History:  Diagnosis Date   Ascending aortic aneurysm (HCC)    Avascular necrosis of femur head, right (HCC)    GERD (gastroesophageal reflux disease)    H/O hiatal hernia    Hyperlipidemia    Smoker     Past Surgical History:  Procedure Laterality Date   ANTERIOR CERVICAL DECOMP/DISCECTOMY FUSION  08/20/2011   Procedure: ANTERIOR CERVICAL DECOMPRESSION/DISCECTOMY FUSION 2 LEVELS;  Surgeon: Darina MALVA Boehringer, MD;  Location: [facility];  Service: Neurosurgery;  Laterality: Bilateral;  Cervical five-six, six-seven anterior cervical discectomy with fusion   BIOPSY  10/08/2022   Procedure: BIOPSY;  Surgeon: Eartha Angelia Sieving, MD;  [facility]: [facility];  Service: Gastroenterology;;   COLONOSCOPY WITH PROPOFOL  N/A 10/08/2022   Procedure: COLONOSCOPY WITH PROPOFOL ;  Surgeon: Eartha Angelia Sieving, MD;  [facility];  Service: Gastroenterology;  Laterality: N/A;  7:30 am, asa 1   ESOPHAGOGASTRODUODENOSCOPY (EGD) WITH PROPOFOL  N/A 10/08/2022   Procedure: ESOPHAGOGASTRODUODENOSCOPY (EGD) WITH PROPOFOL ;  Surgeon: Eartha Angelia Sieving, MD;  [facility];  Service: Gastroenterology;  Laterality: N/A;   EYE SURGERY     HEMOSTASIS CLIP PLACEMENT  10/08/2022   Procedure: HEMOSTASIS CLIP PLACEMENT;  Surgeon: Eartha Angelia,  Toribio, MD;  [facility];  Service: Gastroenterology;;   lasic     POLYPECTOMY  10/08/2022   Procedure: POLYPECTOMY INTESTINAL;  Surgeon: Eartha Angelia Toribio, MD;  [facility];  Service: Gastroenterology;;  hot snare and cold snare   SPINE SURGERY     SUBMUCOSAL LIFTING INJECTION  10/08/2022   Procedure: SUBMUCOSAL LIFTING INJECTION;  Surgeon: Eartha Angelia, Toribio, MD;   [facility];  Service: Gastroenterology;;   TONSILLECTOMY      Current Outpatient Medications  Medication Sig Dispense Refill   albuterol  (VENTOLIN  HFA) 108 (90 Base) MCG/ACT inhaler Inhale 2 puffs into the lungs every 4 (four) hours as needed. 18 g 0   allopurinol  (ZYLOPRIM ) 300 MG tablet TAKE 1 TABLET(300 MG) BY MOUTH DAILY 90 tablet 0   colchicine  0.6 MG tablet TAKE 2 TABLETS BY MOUTH AT FIRST SIGN OF GOUT, THEN 1 TABLET IN 1 HOUR IF PAIN CONTINUES 90 tablet 0   famotidine  (PEPCID ) 40 MG tablet Take 1 tablet (40 mg total) by mouth at bedtime. 60 tablet 1   omeprazole  (PRILOSEC) 40 MG capsule Take 1 capsule (40 mg total) by mouth in the morning and at bedtime. 180 capsule 1   pravastatin  (PRAVACHOL ) 20 MG tablet TAKE 1 TABLET(20 MG) BY MOUTH DAILY 90 tablet 0   sildenafil  (VIAGRA ) 100 MG tablet TAKE 1/2 TO 1 TABLET(50 TO 100 MG) BY MOUTH DAILY AS NEEDED FOR ERECTILE DYSFUNCTION 15 tablet 1   No current facility-administered medications for this visit.    Allergies as of 09/12/2023   (No Known Allergies)    Social History   Socioeconomic History   Marital status: Married    Spouse name: Not on file   Number of children: Not on file   Years of education: 65yr [employer]   Highest education level: Not on file  Occupational History   Occupation: farm work   Tobacco Use   Smoking status: Former    Types: Cigarettes    Start date: 07/2022   Smokeless tobacco: Current    Types: Chew   Tobacco comments:    1 pack of cigarettes every other week   Vaping Use   Vaping status: Never Used  Substance and Sexual Activity   Alcohol use: Yes    Comment: twice weekly, usually on weekends   Drug use: No   Sexual activity: Yes  Other Topics Concern   Not on file  Social History Narrative   Not on file   Social Drivers of Health   Financial Resource Strain: Not on file  Food Insecurity: No Food Insecurity (08/04/2022)   Hunger Vital Sign    Worried About Running Out of Food in  the Last Year: Never true    Ran Out of Food in the Last Year: Never true  Transportation Needs: No Transportation Needs (08/04/2022)   PRAPARE - Administrator, Civil Service (Medical): No    Lack of Transportation (Non-Medical): No  Physical Activity: Not on file  Stress: Not on file  Social Connections: Not on file    Review of systems General: negative for malaise, night sweats, fever, chills, weight loss Neck: Negative for lumps, goiter, pain and significant neck swelling Resp: Negative for cough, wheezing, dyspnea at rest CV: Negative for chest pain, leg swelling, palpitations, orthopnea GI: denies melena, hematochezia, nausea, vomiting, diarrhea, constipation, dysphagia, odyonophagia, early satiety or unintentional weight loss.  MSK: Negative for joint pain or swelling, back pain, and muscle pain. Derm:  Negative for itching or rash Psych: Denies depression, anxiety, memory loss, confusion. No homicidal or suicidal ideation.  Heme: Negative for prolonged bleeding, bruising easily, and swollen nodes. Endocrine: Negative for cold or heat intolerance, polyuria, polydipsia and goiter. Neuro: negative for tremor, gait imbalance, syncope and seizures. The remainder of the review of systems is noncontributory.  Physical Exam: BP 126/85 (BP Location: Left Arm, Patient Position: Sitting, Cuff Size: Large)   Pulse 89   Temp 97.8 F (36.6 C) (Temporal)   Ht 6' 4 (1.93 m)   Wt (!) 300 lb 3.2 oz (136.2 kg)   BMI 36.54 kg/m  General:   Alert and oriented. No distress noted. Pleasant and cooperative.  Head:  Normocephalic and atraumatic. Eyes:  Conjuctiva clear without scleral icterus. Mouth:  Oral mucosa pink and moist. Good dentition. No lesions. Heart: Normal rate and rhythm, s1 and s2 heart sounds present.  Lungs: Clear lung sounds in all lobes. Respirations equal and unlabored. Abdomen:  +BS, soft, non-tender and non-distended. No rebound or guarding. No HSM or masses  noted. Derm: No palmar erythema or jaundice Msk:  Symmetrical without gross deformities. Normal posture. Extremities:  Without edema. Neurologic:  Alert and  oriented x4 Psych:  Alert and cooperative. Normal mood and affect.  Invalid input(s): 6 MONTHS   ASSESSMENT: Sergio Gonzalez is a 48 y.o. male presenting today for follow up of GERD  GERD seems to be doing much better on twice daily PPI and H2B at night with good reflux precautions implemented. He endorses breakthrough only if he eats late which he tries to avoid. Denies dysphagia, odynophagia. For now, will continue with PPI BID and H2B, continued good reflux precautions.    PLAN:  -continue omeprazole  40mg  BID -continue famotidine  40mg  at bedtime -good reflux precautions  All questions were answered, patient verbalized understanding and is in agreement with plan as outlined above.   Follow Up: 1 year  Elaf Clauson L. Mariette, MSN, APRN, AGNP-C Adult-Gerontology Nurse Practitioner [facility]  I have reviewed the note and agree with the APP's assessment as described in this progress note  Toribio Fortune, MD Gastroenterology and Hepatology [facility]

## 2023-09-12 NOTE — Patient Instructions (Signed)
-  continue omeprazole  40mg  twice daily  -continue famotidine  40mg  at bedtime -good reflux precautions to include being mindful of greasy, spicy, fried, citrus foods, caffeine, carbonated drinks, chocolate and alcohol as these can increase reflux symptoms Stay upright 2-3 hours after eating, prior to lying down and avoid eating late in the evenings. -repeat Colonoscopy due in 2027  Follow up 1 year  It was a pleasure to see you today. I want to create trusting relationships with patients and provide genuine, compassionate, and quality care. I truly value your feedback! please be on the lookout for a survey regarding your visit with me today. I appreciate your input about our visit and your time in completing this!    Rosaline Ezekiel L. Jahmari Esbenshade, MSN, APRN, AGNP-C Adult-Gerontology Nurse Practitioner [facility] at [facility]

## 2023-10-02 ENCOUNTER — Other Ambulatory Visit: Payer: Self-pay | Admitting: Family Medicine

## 2023-10-05 ENCOUNTER — Encounter: Payer: Self-pay | Admitting: Emergency Medicine

## 2023-10-05 ENCOUNTER — Other Ambulatory Visit: Payer: Self-pay

## 2023-10-05 ENCOUNTER — Ambulatory Visit
Admission: EM | Admit: 2023-10-05 | Discharge: 2023-10-05 | Disposition: A | Discharge status: Home / Self Care | Attending: Nurse Practitioner | Admitting: Nurse Practitioner

## 2023-10-05 DIAGNOSIS — R059 Cough, unspecified: Secondary | ICD-10-CM

## 2023-10-05 LAB — POC SOFIA SARS ANTIGEN FIA: SARS Coronavirus 2 Ag: NEGATIVE

## 2023-10-05 MED ORDER — PREDNISONE 20 MG PO TABS
40.0000 mg | ORAL_TABLET | Freq: Every day | ORAL | 0 refills | Status: AC
Start: 2023-10-05 — End: 2023-10-10

## 2023-10-05 NOTE — ED Provider Notes (Signed)
 [facility]    CSN: [id number] Arrival date & time: 10/05/23  0959      History   Chief Complaint Chief Complaint  Patient presents with   Cough    HPI Sergio Gonzalez is a 48 y.o. male.   The history is provided by the patient.   Patient presents with a 32 to 3-day history of cough.  Patient states in the morning when he wakes up, he noticed that his cough is raspy.  He also states at night, he can hear a whistling with his breathing when he is laying down.  He denies fever, chills, chest pain, abdominal pain, nausea, vomiting, diarrhea, or rash.  Patient states that he had a coughing episode this morning that almost caused him to pass out.  Patient denies current smoking history, per review of his chart, patient was a past smoker.  He states that he has not taken any medication for his symptoms.  States that he does have an albuterol  inhaler from a previous prescription last year for bronchitis.  Past Medical History:  Diagnosis Date   Ascending aortic aneurysm (HCC)    Avascular necrosis of femur head, right (HCC)    GERD (gastroesophageal reflux disease)    H/O hiatal hernia    Hyperlipidemia    Smoker     Patient Active Problem List   Diagnosis Date Noted   Ascending aortic aneurysm (HCC)    GERD (gastroesophageal reflux disease) 09/20/2022   Personal history of COVID-19 10/12/2019   Tobacco abuse counseling 10/12/2019   Family history of heart disease 10/12/2019   HIP PAIN 04/18/2007    Past Surgical History:  Procedure Laterality Date   ANTERIOR CERVICAL DECOMP/DISCECTOMY FUSION  08/20/2011   Procedure: ANTERIOR CERVICAL DECOMPRESSION/DISCECTOMY FUSION 2 LEVELS;  Surgeon: Darina MALVA Boehringer, MD;  Location: [facility];  Service: Neurosurgery;  Laterality: Bilateral;  Cervical five-six, six-seven anterior cervical discectomy with fusion   BIOPSY  10/08/2022   Procedure: BIOPSY;  Surgeon: Eartha Angelia Sieving, MD;  [facility];  Service:  Gastroenterology;;   COLONOSCOPY WITH PROPOFOL  N/A 10/08/2022   Procedure: COLONOSCOPY WITH PROPOFOL ;  Surgeon: Eartha Angelia Sieving, MD;  [facility];  Service: Gastroenterology;  Laterality: N/A;  7:30 am, asa 1   ESOPHAGOGASTRODUODENOSCOPY (EGD) WITH PROPOFOL  N/A 10/08/2022   Procedure: ESOPHAGOGASTRODUODENOSCOPY (EGD) WITH PROPOFOL ;  Surgeon: Eartha Angelia Sieving, MD;  [facility];  Service: Gastroenterology;  Laterality: N/A;   EYE SURGERY     HEMOSTASIS CLIP PLACEMENT  10/08/2022   Procedure: HEMOSTASIS CLIP PLACEMENT;  Surgeon: Eartha Angelia Sieving, MD;  [facility];  Service: Gastroenterology;;   lasic     POLYPECTOMY  10/08/2022   Procedure: POLYPECTOMY INTESTINAL;  Surgeon: Eartha Angelia Sieving, MD;  [facility];  Service: Gastroenterology;;  hot snare and cold snare   SPINE SURGERY     SUBMUCOSAL LIFTING INJECTION  10/08/2022   Procedure: SUBMUCOSAL LIFTING INJECTION;  Surgeon: Eartha Angelia Sieving, MD;  [facility];  Service: Gastroenterology;;   TONSILLECTOMY         Home Medications    Prior to Admission medications   Medication Sig Start Date End Date Taking? Authorizing Provider  predniSONE  (DELTASONE ) 20 MG tablet Take 2 tablets (40 mg total) by mouth daily with breakfast for 5 days. 10/05/23 10/10/23 Yes Leath-Warren, Etta PARAS, NP  albuterol  (VENTOLIN  HFA) 108 (90 Base) MCG/ACT inhaler Inhale 2 puffs into the lungs every 4 (four) hours  as needed. 12/28/22   Stuart Vernell Norris, PA-C  allopurinol  (ZYLOPRIM ) 300 MG tablet TAKE 1 TABLET(300 MG) BY MOUTH DAILY 10/04/23   Duanne Butler DASEN, MD  colchicine  0.6 MG tablet TAKE 2 TABLETS BY MOUTH AT FIRST SIGN OF GOUT, THEN 1 TABLET IN 1 HOUR IF PAIN CONTINUES 09/17/22   Duanne Butler DASEN, MD  famotidine  (PEPCID ) 40 MG tablet Take 1 tablet (40 mg total) by mouth at bedtime. 09/12/23   Carlan, Chelsea L, NP  omeprazole  (PRILOSEC) 40 MG capsule Take 1 capsule  (40 mg total) by mouth in the morning and at bedtime. 05/10/23   Carlan, Mitzie CROME, NP  pravastatin  (PRAVACHOL ) 20 MG tablet TAKE 1 TABLET(20 MG) BY MOUTH DAILY 10/04/23   Duanne Butler DASEN, MD  sildenafil  (VIAGRA ) 100 MG tablet TAKE 1/2 TO 1 TABLET(50 TO 100 MG) BY MOUTH DAILY AS NEEDED FOR ERECTILE DYSFUNCTION 10/06/22   Duanne Butler DASEN, MD    Family History Family History  Problem Relation Age of Onset   Cancer Other        family history    Diabetes Other        family history    Cancer Mother    Cancer Maternal Grandfather    Cancer Paternal Grandfather     Social History Social History   Tobacco Use   Smoking status: Former    Types: Cigarettes    Start date: 07/2022   Smokeless tobacco: Current    Types: Chew   Tobacco comments:    1 pack of cigarettes every other week   Vaping Use   Vaping status: Never Used  Substance Use Topics   Alcohol use: Yes    Comment: twice weekly, usually on weekends   Drug use: No     Allergies   Patient has no known allergies.   Review of Systems Review of Systems Per HPI  Physical Exam Triage Vital Signs ED Triage Vitals  Encounter Vitals Group     BP 10/05/23 1006 126/79     Girls Systolic BP Percentile --      Girls Diastolic BP Percentile --      Boys Systolic BP Percentile --      Boys Diastolic BP Percentile --      Pulse Rate 10/05/23 1006 98     Resp 10/05/23 1006 20     Temp 10/05/23 1006 98.3 F (36.8 C)     Temp Source 10/05/23 1006 Oral     SpO2 10/05/23 1006 97 %     Weight --      Height --      Head Circumference --      Peak Flow --      Pain Score 10/05/23 1011 2     Pain Loc --      Pain Education --      Exclude from Growth Chart --    No data found.  Updated Vital Signs BP 126/79 (BP Location: Right Arm)   Pulse 98   Temp 98.3 F (36.8 C) (Oral)   Resp 20   SpO2 97%   Visual Acuity Right Eye Distance:   Left Eye Distance:   Bilateral Distance:    Right Eye Near:   Left Eye Near:     Bilateral Near:     Physical Exam Vitals and nursing note reviewed.  Constitutional:      General: He is not in acute distress.    Appearance: Normal appearance.  HENT:  Head: Normocephalic.     Right Ear: Tympanic membrane, ear canal and external ear normal.     Left Ear: Tympanic membrane, ear canal and external ear normal.     Nose: Nose normal.     Mouth/Throat:     Mouth: Mucous membranes are moist.     Pharynx: Posterior oropharyngeal erythema present.  Eyes:     Extraocular Movements: Extraocular movements intact.     Conjunctiva/sclera: Conjunctivae normal.     Pupils: Pupils are equal, round, and reactive to light.  Cardiovascular:     Rate and Rhythm: Normal rate and regular rhythm.     Pulses: Normal pulses.     Heart sounds: Normal heart sounds.  Pulmonary:     Effort: Pulmonary effort is normal. No respiratory distress.     Breath sounds: Normal breath sounds. No stridor. No wheezing, rhonchi or rales.  Abdominal:     General: Bowel sounds are normal.     Palpations: Abdomen is soft.     Tenderness: There is no abdominal tenderness.  Musculoskeletal:     Cervical back: Normal range of motion.  Skin:    General: Skin is warm and dry.  Neurological:     General: No focal deficit present.     Mental Status: He is alert and oriented to person, place, and time.  Psychiatric:        Mood and Affect: Mood normal.        Behavior: Behavior normal.      UC Treatments / Results  Labs (all labs ordered are listed, but only abnormal results are displayed) Labs Reviewed  POC SOFIA SARS ANTIGEN FIA    EKG   Radiology No results found.  Procedures Procedures (including critical care time)  Medications Ordered in UC Medications - No data to display  Initial Impression / Assessment and Plan / UC Course  I have reviewed the triage vital signs and the nursing notes.  Pertinent labs & imaging results that were available during my care of the patient  were reviewed by me and considered in my medical decision making (see chart for details).  On exam, the patient's lung sounds are clear throughout, room air sats at 97%.  He does report symptoms in the morning, cannot rule out wheezing.  Symptoms consistent with acute viral bronchitis.  Will treat with prednisone  40 mg for the next 5 days.  Patient states that he does have his albuterol  inhaler at home along with remaining Promethazine  DM.  Supportive care recommendations were provided discussed with the patient to include fluids, rest, over-the-counter analgesics, and use of a humidifier during sleep.  Discussed indications with patient regarding follow-up.  Patient was in agreement with this plan of care and verbalizes understanding.  All questions were answered.  Patient stable for discharge.  Final Clinical Impressions(s) / UC Diagnoses   Final diagnoses:  Cough, unspecified type     Discharge Instructions      The COVID test was negative. Take medication as prescribed.  Continue use of the promethazine  DM that you have at home along with your albuterol  inhaler as needed for cough. You may take over-the-counter Tylenol  or ibuprofen  as needed for pain, fever, or general discomfort. Recommend use of a humidifier in your bedroom at nighttime during sleep and sleeping elevated on pillows while cough symptoms persist. Please be advised that your cough may last from days to weeks.  If you continue to have a persistent nagging cough, but generally feel well, continue  over-the-counter cough medications, fluids, and rest.  If you develop new symptoms such as fever, wheezing, shortness of breath, or difficulty breathing, seek care. Follow-up as needed.     ED Prescriptions     Medication Sig Dispense Auth. Provider   predniSONE  (DELTASONE ) 20 MG tablet Take 2 tablets (40 mg total) by mouth daily with breakfast for 5 days. 10 tablet Leath-Warren, Etta PARAS, NP      PDMP not reviewed this  encounter.   Gilmer Etta PARAS, NP 10/05/23 1052

## 2023-10-05 NOTE — Discharge Instructions (Addendum)
 The COVID test was negative. Take medication as prescribed.  Continue use of the promethazine  DM that you have at home along with your albuterol  inhaler as needed for cough. You may take over-the-counter Tylenol  or ibuprofen  as needed for pain, fever, or general discomfort. Recommend use of a humidifier in your bedroom at nighttime during sleep and sleeping elevated on pillows while cough symptoms persist. Please be advised that your cough may last from days to weeks.  If you continue to have a persistent nagging cough, but generally feel well, continue over-the-counter cough medications, fluids, and rest.  If you develop new symptoms such as fever, wheezing, shortness of breath, or difficulty breathing, seek care. Follow-up as needed.

## 2023-10-05 NOTE — ED Triage Notes (Addendum)
 Pt reports cough for last several days. Pt reports noisy breathing first thing in the morning. Reports most recent coughing spell was so bad that I almost passed out.   Reports has albuterol  inhaler at home but reports couldn't find it this morning in time. Denies any other otc medications or other symptoms at this time.   NAD noted. Pt alert and oriented. Airway patent. Denies shortness of breath at this time.

## 2023-10-12 ENCOUNTER — Ambulatory Visit: Attending: Family Medicine

## 2023-10-12 DIAGNOSIS — I7121 Aneurysm of the ascending aorta, without rupture: Secondary | ICD-10-CM | POA: Diagnosis not present

## 2023-10-12 LAB — ECHOCARDIOGRAM COMPLETE
AR max vel: 2.82 cm2
AV Area VTI: 3.04 cm2
AV Area mean vel: 2.96 cm2
AV Mean grad: 4 mmHg
AV Peak grad: 8.5 mmHg
Ao pk vel: 1.46 m/s
Area-P 1/2: 2.48 cm2
Calc EF: 58.6 %
S' Lateral: 3.3 cm
Single Plane A2C EF: 60.1 %
Single Plane A4C EF: 56.4 %

## 2023-11-16 ENCOUNTER — Encounter (INDEPENDENT_AMBULATORY_CARE_PROVIDER_SITE_OTHER): Payer: Self-pay | Admitting: Gastroenterology

## 2023-12-06 ENCOUNTER — Other Ambulatory Visit (INDEPENDENT_AMBULATORY_CARE_PROVIDER_SITE_OTHER): Payer: Self-pay | Admitting: Gastroenterology

## 2023-12-06 DIAGNOSIS — K219 Gastro-esophageal reflux disease without esophagitis: Secondary | ICD-10-CM

## 2024-01-01 ENCOUNTER — Other Ambulatory Visit: Payer: Self-pay | Admitting: Family Medicine
